# Patient Record
Sex: Female | Born: 1950
Health system: Southern US, Community
[De-identification: ages and names within clinical notes are randomized; demographics above are authoritative.]

## PROBLEM LIST (undated history)

## (undated) DIAGNOSIS — C801 Malignant (primary) neoplasm, unspecified: Secondary | ICD-10-CM

## (undated) DIAGNOSIS — T7840XA Allergy, unspecified, initial encounter: Secondary | ICD-10-CM

## (undated) DIAGNOSIS — E785 Hyperlipidemia, unspecified: Secondary | ICD-10-CM

## (undated) HISTORY — DX: Allergy, unspecified, initial encounter: T78.40XA

## (undated) HISTORY — PX: NASAL SEPTUM SURGERY: SHX37

## (undated) HISTORY — DX: Malignant (primary) neoplasm, unspecified: C80.1

## (undated) HISTORY — PX: NOSE SURGERY: SHX723

## (undated) HISTORY — DX: Hyperlipidemia, unspecified: E78.5

## (undated) HISTORY — PX: TONSILLECTOMY: SUR1361

## (undated) HISTORY — PX: KNEE SURGERY: SHX244

---

## 1998-08-16 ENCOUNTER — Other Ambulatory Visit: Admission: RE | Admit: 1998-08-16 | Discharge: 1998-08-16 | Payer: Self-pay | Admitting: Obstetrics and Gynecology

## 1998-10-18 ENCOUNTER — Ambulatory Visit (HOSPITAL_BASED_OUTPATIENT_CLINIC_OR_DEPARTMENT_OTHER): Admission: RE | Admit: 1998-10-18 | Discharge: 1998-10-18 | Payer: Self-pay | Admitting: *Deleted

## 1999-12-04 ENCOUNTER — Other Ambulatory Visit: Admission: RE | Admit: 1999-12-04 | Discharge: 1999-12-04 | Payer: Self-pay | Admitting: Obstetrics and Gynecology

## 2002-07-12 ENCOUNTER — Other Ambulatory Visit: Admission: RE | Admit: 2002-07-12 | Discharge: 2002-07-12 | Payer: Self-pay | Admitting: Obstetrics and Gynecology

## 2003-08-21 ENCOUNTER — Other Ambulatory Visit: Admission: RE | Admit: 2003-08-21 | Discharge: 2003-08-21 | Payer: Self-pay | Admitting: Obstetrics and Gynecology

## 2005-04-14 ENCOUNTER — Other Ambulatory Visit: Admission: RE | Admit: 2005-04-14 | Discharge: 2005-04-14 | Payer: Self-pay | Admitting: Obstetrics and Gynecology

## 2007-07-21 ENCOUNTER — Encounter: Admission: RE | Admit: 2007-07-21 | Discharge: 2007-07-21 | Payer: Self-pay | Admitting: Family Medicine

## 2008-06-13 ENCOUNTER — Encounter: Admission: RE | Admit: 2008-06-13 | Discharge: 2008-07-06 | Payer: Self-pay | Admitting: Orthopedic Surgery

## 2008-10-11 ENCOUNTER — Ambulatory Visit: Payer: Self-pay | Admitting: Obstetrics and Gynecology

## 2008-10-11 ENCOUNTER — Other Ambulatory Visit: Admission: RE | Admit: 2008-10-11 | Discharge: 2008-10-11 | Payer: Self-pay | Admitting: Obstetrics and Gynecology

## 2008-10-11 ENCOUNTER — Encounter: Payer: Self-pay | Admitting: Obstetrics and Gynecology

## 2008-10-30 ENCOUNTER — Ambulatory Visit: Payer: Self-pay | Admitting: Obstetrics and Gynecology

## 2011-06-13 ENCOUNTER — Encounter: Payer: Self-pay | Admitting: Obstetrics and Gynecology

## 2012-06-08 ENCOUNTER — Encounter: Payer: Self-pay | Admitting: Obstetrics and Gynecology

## 2014-12-13 ENCOUNTER — Encounter: Payer: Self-pay | Admitting: Gynecology

## 2015-01-02 ENCOUNTER — Other Ambulatory Visit (HOSPITAL_COMMUNITY)
Admission: RE | Admit: 2015-01-02 | Discharge: 2015-01-02 | Disposition: A | Discharge status: Home / Self Care | Payer: 59 | Source: Ambulatory Visit | Attending: Gynecology | Admitting: Gynecology

## 2015-01-02 ENCOUNTER — Encounter: Payer: Self-pay | Admitting: Gynecology

## 2015-01-02 ENCOUNTER — Ambulatory Visit (INDEPENDENT_AMBULATORY_CARE_PROVIDER_SITE_OTHER): Payer: 59 | Admitting: Gynecology

## 2015-01-02 VITALS — BP 130/80 | Ht 62.0 in | Wt 132.0 lb

## 2015-01-02 DIAGNOSIS — Z01419 Encounter for gynecological examination (general) (routine) without abnormal findings: Secondary | ICD-10-CM | POA: Diagnosis not present

## 2015-01-02 DIAGNOSIS — Z780 Asymptomatic menopausal state: Secondary | ICD-10-CM | POA: Diagnosis not present

## 2015-01-02 DIAGNOSIS — N952 Postmenopausal atrophic vaginitis: Secondary | ICD-10-CM

## 2015-01-02 DIAGNOSIS — Z1151 Encounter for screening for human papillomavirus (HPV): Secondary | ICD-10-CM | POA: Diagnosis present

## 2015-01-02 DIAGNOSIS — Z79890 Hormone replacement therapy (postmenopausal): Secondary | ICD-10-CM | POA: Diagnosis not present

## 2015-01-02 MED ORDER — ESTRADIOL 10 MCG VA TABS: 1.0000 | ORAL_TABLET | VAGINAL | Status: DC

## 2015-01-02 NOTE — Patient Instructions (Signed)
Estradiol vaginal tablets What is this medicine? ESTRADIOL (es tra DYE ole) vaginal tablet is used to help relieve symptoms of vaginal irritation and dryness that occurs in some women during menopause. This medicine may be used for other purposes; ask your health care provider or pharmacist if you have questions. COMMON BRAND NAME(S): Vagifem What should I tell my health care provider before I take this medicine? They need to know if you have any of these conditions: -abnormal vaginal bleeding -blood vessel disease or blood clots -breast, cervical, endometrial, ovarian, liver, or uterine cancer -dementia -diabetes -gallbladder disease -heart disease or recent heart attack -high blood pressure -high cholesterol -high level of calcium in the blood -hysterectomy -kidney disease -liver disease -migraine headaches -protein C deficiency -protein S deficiency -stroke -systemic lupus erythematosus (SLE) -tobacco smoker -an unusual or allergic reaction to estrogens, other hormones, medicines, foods, dyes, or preservatives -pregnant or trying to get pregnant -breast-feeding How should I use this medicine? This medicine is only for use in the vagina. Do not take by mouth. Wash your hands before and after use. Read package directions carefully. Unwrap the pre-filled applicator package. Lie on your back, part and bend your knees. Gently insert the applicator tip high in the vagina and push the plunger to release the tablet into the vagina. Gently remove the applicator. Throw away the applicator after use. Do not use your medicine more often than directed. Finish the full course prescribed by your doctor or health care professional even if you think your condition is better. Do not stop using except on the advice of your doctor or health care professional. Talk to your pediatrician regarding the use of this medicine in children. A patient package insert for the product will be given with each  prescription and refill. Read this sheet carefully each time. The sheet may change frequently. Overdosage: If you think you have taken too much of this medicine contact a poison control center or emergency room at once. NOTE: This medicine is only for you. Do not share this medicine with others. What if I miss a dose? If you miss a dose, take it as soon as you can. If it is almost time for your next dose, take only that dose. Do not take double or extra doses. What may interact with this medicine? Do not take this medicine with any of the following medications: -aromatase inhibitors like aminoglutethimide, anastrozole, exemestane, letrozole, testolactone This medicine may also interact with the following medications: -antibiotics used to treat tuberculosis like rifabutin, rifampin and rifapentene -raloxifene or tamoxifen -warfarin This list may not describe all possible interactions. Give your health care provider a list of all the medicines, herbs, non-prescription drugs, or dietary supplements you use. Also tell them if you smoke, drink alcohol, or use illegal drugs. Some items may interact with your medicine. What should I watch for while using this medicine? Visit your health care professional for regular checks on your progress. You will need a regular breast and pelvic exam. You should also discuss the need for regular mammograms with your health care professional, and follow his or her guidelines. This medicine can make your body retain fluid, making your fingers, hands, or ankles swell. Your blood pressure can go up. Contact your doctor or health care professional if you feel you are retaining fluid. If you have any reason to think you are pregnant; stop taking this medicine at once and contact your doctor or health care professional. Tobacco smoking increases the risk of getting  a blood clot or having a stroke, especially if you are more than 63 years old. You are strongly advised not to  smoke. If you wear contact lenses and notice visual changes, or if the lenses begin to feel uncomfortable, consult your eye care specialist. If you are going to have elective surgery, you may need to stop taking this medicine beforehand. Consult your health care professional for advice prior to scheduling the surgery. What side effects may I notice from receiving this medicine? Side effects that you should report to your doctor or health care professional as soon as possible: -allergic reactions like skin rash, itching or hives, swelling of the face, lips, or tongue -breast tissue changes or discharge -changes in vision -chest pain -confusion, trouble speaking or understanding -dark urine -general ill feeling or flu-like symptoms -light-colored stools -nausea, vomiting -pain, swelling, warmth in the leg -right upper belly pain -severe headaches -shortness of breath -sudden numbness or weakness of the face, arm or leg -trouble walking, dizziness, loss of balance or coordination -unusual vaginal bleeding -yellowing of the eyes or skin Side effects that usually do not require medical attention (report to your doctor or health care professional if they continue or are bothersome): -hair loss -increased hunger or thirst -increased urination -symptoms of vaginal infection like itching, irritation or unusual discharge -unusually weak or tired This list may not describe all possible side effects. Call your doctor for medical advice about side effects. You may report side effects to FDA at 1-800-FDA-1088. Where should I keep my medicine? Keep out of the reach of children. Store at room temperature between 15 and 30 degrees C (59 and 86 degrees F). Throw away any unused medicine after the expiration date. NOTE: This sheet is a summary. It may not cover all possible information. If you have questions about this medicine, talk to your doctor, pharmacist, or health care provider.  2015,  [employer]. (2010-08-28 09:08:58)

## 2015-01-02 NOTE — Progress Notes (Signed)
Sandra Dickerson November 10, 1950 [id number]   History:    64 y.o.  for annual gyn exam who has not been seen in the office in 2010. Patient denies any prior history of any abnormal Pap smear. Her last bone density study was reported to be normal according to patient. She has not had a colonoscopy as of yet. She has never been on any hormone replacement therapy. She is complaining of vaginal dryness and discomfort during intercourse. She is establishing with a new PCP which she will see in the next few weeks we'll be doing her blood work. 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  Gynecologic History No LMP recorded. Patient is postmenopausal. Contraception: post menopausal status Last Pap: 2010. Results were: normal Last mammogram: 2016. Results were: normal  Obstetric History OB History  Gravida Para Term Preterm AB SAB TAB Ectopic Multiple Living  2 2        2     # Outcome Date GA Lbr Len/2nd Weight Sex Delivery Anes PTL Lv  2 Para     F Vag-Spont     1 Para     M Vag-Spont          ROS: A ROS was performed and pertinent positives and negatives are included in the history.  GENERAL: No fevers or chills. HEENT: No change in vision, no earache, sore throat or sinus congestion. NECK: No pain or stiffness. CARDIOVASCULAR: No chest pain or pressure. No palpitations. PULMONARY: No shortness of breath, cough or wheeze. GASTROINTESTINAL: No abdominal pain, nausea, vomiting or diarrhea, melena or bright red blood per rectum. GENITOURINARY: No urinary frequency, urgency, hesitancy or dysuria. MUSCULOSKELETAL: No joint or muscle pain, no back pain, no recent trauma. DERMATOLOGIC: No rash, no itching, no lesions. ENDOCRINE: No polyuria, polydipsia, no heat or cold intolerance. No recent change in weight. HEMATOLOGICAL: No anemia or easy bruising or bleeding. NEUROLOGIC: No  headache, seizures, numbness, tingling or weakness. PSYCHIATRIC: No depression, no loss of interest in normal activity or change in sleep pattern.     Exam: chaperone present  BP 130/80 mmHg  Ht 5\' 2"  (1.575 m)  Wt 132 lb (59.875 kg)  BMI 24.14 kg/m2  Body mass index is 24.14 kg/(m^2).  General appearance : Well developed well nourished female. No acute distress HEENT: Eyes: no retinal hemorrhage or exudates,  Neck supple, trachea midline, no carotid bruits, no thyroidmegaly Lungs: Clear to auscultation, no rhonchi or wheezes, or rib retractions  Heart: Regular rate and rhythm, no murmurs or gallops Breast:Examined in sitting and supine position were symmetrical in appearance, no palpable masses or tenderness,  no skin retraction, no nipple inversion, no nipple discharge, no skin discoloration, no axillary or supraclavicular lymphadenopathy Abdomen: no palpable masses or tenderness, no rebound or guarding Extremities: no edema or skin discoloration or tenderness  Pelvic:  Bartholin, Urethra, Skene Glands: Within normal limits             Vagina: No gross lesions or discharge, atrophic changes  Cervix: No gross lesions or discharge  Uterus  anteverted, normal size, shape and consistency, non-tender and mobile  Adnexa  Without masses or tenderness  Anus and perineum  normal   Rectovaginal  normal sphincter tone without palpated masses or tenderness             Hemoccult colonoscopy to be scheduled     Assessment/Plan:  65 y.o. female for annual exam with postmenopausal vaginal atrophy causing discomfort during intercourse and irritation. She's having no vasomotor symptoms. She will be prescribed Vagifem 10 g to apply intravaginally twice a week. Risk benefits and pros and cons were discussed. PCP will be drawn her blood work. Pap smear with HPV screening done today since her last Pap smear was 6 years ago. She was given the name of community gastroenterologist to schedule colonoscopy.  She will schedule a bone density study here in the office the next few weeks. We discussed importance of calcium vitamin D and regular exercise for osteoporosis prevention.   Terrance Mass MD, 2:50 PM 01/02/2015

## 2015-01-04 LAB — CYTOLOGY - PAP

## 2015-01-15 ENCOUNTER — Telehealth: Payer: Self-pay | Admitting: Behavioral Health

## 2015-01-15 NOTE — Telephone Encounter (Signed)
Unable to reach patient at time of Pre-Visit Call.  Left message for patient to return call when available.    

## 2015-01-16 ENCOUNTER — Ambulatory Visit (INDEPENDENT_AMBULATORY_CARE_PROVIDER_SITE_OTHER): Payer: 59 | Admitting: Family Medicine

## 2015-01-16 ENCOUNTER — Encounter: Payer: Self-pay | Admitting: Family Medicine

## 2015-01-16 VITALS — BP 126/80 | HR 63 | Temp 98.3°F | Ht 62.0 in | Wt 133.2 lb

## 2015-01-16 DIAGNOSIS — E2839 Other primary ovarian failure: Secondary | ICD-10-CM | POA: Diagnosis not present

## 2015-01-16 DIAGNOSIS — M79671 Pain in right foot: Secondary | ICD-10-CM | POA: Diagnosis not present

## 2015-01-16 DIAGNOSIS — R609 Edema, unspecified: Secondary | ICD-10-CM

## 2015-01-16 DIAGNOSIS — M79672 Pain in left foot: Secondary | ICD-10-CM | POA: Insufficient documentation

## 2015-01-16 DIAGNOSIS — Z23 Encounter for immunization: Secondary | ICD-10-CM

## 2015-01-16 DIAGNOSIS — R252 Cramp and spasm: Secondary | ICD-10-CM

## 2015-01-16 DIAGNOSIS — Z0000 Encounter for general adult medical examination without abnormal findings: Secondary | ICD-10-CM

## 2015-01-16 NOTE — Progress Notes (Signed)
Pre visit review using our clinic review tool, if applicable. No additional management support is needed unless otherwise documented below in the visit note. 

## 2015-01-16 NOTE — Patient Instructions (Signed)
Preventive Care for Adults A healthy lifestyle and preventive care can promote health and wellness. Preventive health guidelines for women include the following key practices.  A routine yearly physical is a good way to check with your health care provider about your health and preventive screening. It is a chance to share any concerns and updates on your health and to receive a thorough exam.  Visit your dentist for a routine exam and preventive care every 6 months. Brush your teeth twice a day and floss once a day. Good oral hygiene prevents tooth decay and gum disease.  The frequency of eye exams is based on your age, health, family medical history, use of contact lenses, and other factors. Follow your health care provider's recommendations for frequency of eye exams.  Eat a healthy diet. Foods like vegetables, fruits, whole grains, low-fat dairy products, and lean protein foods contain the nutrients you need without too many calories. Decrease your intake of foods high in solid fats, added sugars, and salt. Eat the right amount of calories for you.Get information about a proper diet from your health care provider, if necessary.  Regular physical exercise is one of the most important things you can do for your health. Most adults should get at least 150 minutes of moderate-intensity exercise (any activity that increases your heart rate and causes you to sweat) each week. In addition, most adults need muscle-strengthening exercises on 2 or more days a week.  Maintain a healthy weight. The body mass index (BMI) is a screening tool to identify possible weight problems. It provides an estimate of body fat based on height and weight. Your health care provider can find your BMI and can help you achieve or maintain a healthy weight.For adults 20 years and older:  A BMI below 18.5 is considered underweight.  A BMI of 18.5 to 24.9 is normal.  A BMI of 25 to 29.9 is considered overweight.  A BMI of  30 and above is considered obese.  Maintain normal blood lipids and cholesterol levels by exercising and minimizing your intake of saturated fat. Eat a balanced diet with plenty of fruit and vegetables. Blood tests for lipids and cholesterol should begin at age 76 and be repeated every 5 years. If your lipid or cholesterol levels are high, you are over 50, or you are at high risk for heart disease, you may need your cholesterol levels checked more frequently.Ongoing high lipid and cholesterol levels should be treated with medicines if diet and exercise are not working.  If you smoke, find out from your health care provider how to quit. If you do not use tobacco, do not start.  Lung cancer screening is recommended for adults aged 22-80 years who are at high risk for developing lung cancer because of a history of smoking. A yearly low-dose CT scan of the lungs is recommended for people who have at least a 30-pack-year history of smoking and are a current smoker or have quit within the past 15 years. A pack year of smoking is smoking an average of 1 pack of cigarettes a day for 1 year (for example: 1 pack a day for 30 years or 2 packs a day for 15 years). Yearly screening should continue until the smoker has stopped smoking for at least 15 years. Yearly screening should be stopped for people who develop a health problem that would prevent them from having lung cancer treatment.  If you are pregnant, do not drink alcohol. If you are breastfeeding,  be very cautious about drinking alcohol. If you are not pregnant and choose to drink alcohol, do not have more than 1 drink per day. One drink is considered to be 12 ounces (355 mL) of beer, 5 ounces (148 mL) of wine, or 1.5 ounces (44 mL) of liquor.  Avoid use of street drugs. Do not share needles with anyone. Ask for help if you need support or instructions about stopping the use of drugs.  High blood pressure causes heart disease and increases the risk of  stroke. Your blood pressure should be checked at least every 1 to 2 years. Ongoing high blood pressure should be treated with medicines if weight loss and exercise do not work.  If you are 75-52 years old, ask your health care provider if you should take aspirin to prevent strokes.  Diabetes screening involves taking a blood sample to check your fasting blood sugar level. This should be done once every 3 years, after age 15, if you are within normal weight and without risk factors for diabetes. Testing should be considered at a younger age or be carried out more frequently if you are overweight and have at least 1 risk factor for diabetes.  Breast cancer screening is essential preventive care for women. You should practice "breast self-awareness." This means understanding the normal appearance and feel of your breasts and may include breast self-examination. Any changes detected, no matter how small, should be reported to a health care provider. Women in their 58s and 30s should have a clinical breast exam (CBE) by a health care provider as part of a regular health exam every 1 to 3 years. After age 16, women should have a CBE every year. Starting at age 53, women should consider having a mammogram (breast X-ray test) every year. Women who have a family history of breast cancer should talk to their health care provider about genetic screening. Women at a high risk of breast cancer should talk to their health care providers about having an MRI and a mammogram every year.  Breast cancer gene (BRCA)-related cancer risk assessment is recommended for women who have family members with BRCA-related cancers. BRCA-related cancers include breast, ovarian, tubal, and peritoneal cancers. Having family members with these cancers may be associated with an increased risk for harmful changes (mutations) in the breast cancer genes BRCA1 and BRCA2. Results of the assessment will determine the need for genetic counseling and  BRCA1 and BRCA2 testing.  Routine pelvic exams to screen for cancer are no longer recommended for nonpregnant women who are considered low risk for cancer of the pelvic organs (ovaries, uterus, and vagina) and who do not have symptoms. Ask your health care provider if a screening pelvic exam is right for you.  If you have had past treatment for cervical cancer or a condition that could lead to cancer, you need Pap tests and screening for cancer for at least 20 years after your treatment. If Pap tests have been discontinued, your risk factors (such as having a new sexual partner) need to be reassessed to determine if screening should be resumed. Some women have medical problems that increase the chance of getting cervical cancer. In these cases, your health care provider may recommend more frequent screening and Pap tests.  The HPV test is an additional test that may be used for cervical cancer screening. The HPV test looks for the virus that can cause the cell changes on the cervix. The cells collected during the Pap test can be  tested for HPV. The HPV test could be used to screen women aged 30 years and older, and should be used in women of any age who have unclear Pap test results. After the age of 30, women should have HPV testing at the same frequency as a Pap test.  Colorectal cancer can be detected and often prevented. Most routine colorectal cancer screening begins at the age of 50 years and continues through age 75 years. However, your health care provider may recommend screening at an earlier age if you have risk factors for colon cancer. On a yearly basis, your health care provider may provide home test kits to check for hidden blood in the stool. Use of a small camera at the end of a tube, to directly examine the colon (sigmoidoscopy or colonoscopy), can detect the earliest forms of colorectal cancer. Talk to your health care provider about this at age 50, when routine screening begins. Direct  exam of the colon should be repeated every 5-10 years through age 75 years, unless early forms of pre-cancerous polyps or small growths are found.  People who are at an increased risk for hepatitis B should be screened for this virus. You are considered at high risk for hepatitis B if:  You were born in a country where hepatitis B occurs often. Talk with your health care provider about which countries are considered high risk.  Your parents were born in a high-risk country and you have not received a shot to protect against hepatitis B (hepatitis B vaccine).  You have HIV or AIDS.  You use needles to inject street drugs.  You live with, or have sex with, someone who has hepatitis B.  You get hemodialysis treatment.  You take certain medicines for conditions like cancer, organ transplantation, and autoimmune conditions.  Hepatitis C blood testing is recommended for all people born from 1945 through 1965 and any individual with known risks for hepatitis C.  Practice safe sex. Use condoms and avoid high-risk sexual practices to reduce the spread of sexually transmitted infections (STIs). STIs include gonorrhea, chlamydia, syphilis, trichomonas, herpes, HPV, and human immunodeficiency virus (HIV). Herpes, HIV, and HPV are viral illnesses that have no cure. They can result in disability, cancer, and death.  You should be screened for sexually transmitted illnesses (STIs) including gonorrhea and chlamydia if:  You are sexually active and are younger than 24 years.  You are older than 24 years and your health care provider tells you that you are at risk for this type of infection.  Your sexual activity has changed since you were last screened and you are at an increased risk for chlamydia or gonorrhea. Ask your health care provider if you are at risk.  If you are at risk of being infected with HIV, it is recommended that you take a prescription medicine daily to prevent HIV infection. This is  called preexposure prophylaxis (PrEP). You are considered at risk if:  You are a heterosexual woman, are sexually active, and are at increased risk for HIV infection.  You take drugs by injection.  You are sexually active with a partner who has HIV.  Talk with your health care provider about whether you are at high risk of being infected with HIV. If you choose to begin PrEP, you should first be tested for HIV. You should then be tested every 3 months for as long as you are taking PrEP.  Osteoporosis is a disease in which the bones lose minerals and strength   with aging. This can result in serious bone fractures or breaks. The risk of osteoporosis can be identified using a bone density scan. Women ages 65 years and over and women at risk for fractures or osteoporosis should discuss screening with their health care providers. Ask your health care provider whether you should take a calcium supplement or vitamin D to reduce the rate of osteoporosis.  Menopause can be associated with physical symptoms and risks. Hormone replacement therapy is available to decrease symptoms and risks. You should talk to your health care provider about whether hormone replacement therapy is right for you.  Use sunscreen. Apply sunscreen liberally and repeatedly throughout the day. You should seek shade when your shadow is shorter than you. Protect yourself by wearing long sleeves, pants, a wide-brimmed hat, and sunglasses year round, whenever you are outdoors.  Once a month, do a whole body skin exam, using a mirror to look at the skin on your back. Tell your health care provider of new moles, moles that have irregular borders, moles that are larger than a pencil eraser, or moles that have changed in shape or color.  Stay current with required vaccines (immunizations).  Influenza vaccine. All adults should be immunized every year.  Tetanus, diphtheria, and acellular pertussis (Td, Tdap) vaccine. Pregnant women should  receive 1 dose of Tdap vaccine during each pregnancy. The dose should be obtained regardless of the length of time since the last dose. Immunization is preferred during the 27th-36th week of gestation. An adult who has not previously received Tdap or who does not know her vaccine status should receive 1 dose of Tdap. This initial dose should be followed by tetanus and diphtheria toxoids (Td) booster doses every 10 years. Adults with an unknown or incomplete history of completing a 3-dose immunization series with Td-containing vaccines should begin or complete a primary immunization series including a Tdap dose. Adults should receive a Td booster every 10 years.  Varicella vaccine. An adult without evidence of immunity to varicella should receive 2 doses or a second dose if she has previously received 1 dose. Pregnant females who do not have evidence of immunity should receive the first dose after pregnancy. This first dose should be obtained before leaving the health care facility. The second dose should be obtained 4-8 weeks after the first dose.  Human papillomavirus (HPV) vaccine. Females aged 13-26 years who have not received the vaccine previously should obtain the 3-dose series. The vaccine is not recommended for use in pregnant females. However, pregnancy testing is not needed before receiving a dose. If a female is found to be pregnant after receiving a dose, no treatment is needed. In that case, the remaining doses should be delayed until after the pregnancy. Immunization is recommended for any person with an immunocompromised condition through the age of 26 years if she did not get any or all doses earlier. During the 3-dose series, the second dose should be obtained 4-8 weeks after the first dose. The third dose should be obtained 24 weeks after the first dose and 16 weeks after the second dose.  Zoster vaccine. One dose is recommended for adults aged 60 years or older unless certain conditions are  present.  Measles, mumps, and rubella (MMR) vaccine. Adults born before 1957 generally are considered immune to measles and mumps. Adults born in 1957 or later should have 1 or more doses of MMR vaccine unless there is a contraindication to the vaccine or there is laboratory evidence of immunity to   each of the three diseases. A routine second dose of MMR vaccine should be obtained at least 28 days after the first dose for students attending postsecondary schools, health care workers, or international travelers. People who received inactivated measles vaccine or an unknown type of measles vaccine during 1963-1967 should receive 2 doses of MMR vaccine. People who received inactivated mumps vaccine or an unknown type of mumps vaccine before 1979 and are at high risk for mumps infection should consider immunization with 2 doses of MMR vaccine. For females of childbearing age, rubella immunity should be determined. If there is no evidence of immunity, females who are not pregnant should be vaccinated. If there is no evidence of immunity, females who are pregnant should delay immunization until after pregnancy. Unvaccinated health care workers born before 1957 who lack laboratory evidence of measles, mumps, or rubella immunity or laboratory confirmation of disease should consider measles and mumps immunization with 2 doses of MMR vaccine or rubella immunization with 1 dose of MMR vaccine.  Pneumococcal 13-valent conjugate (PCV13) vaccine. When indicated, a person who is uncertain of her immunization history and has no record of immunization should receive the PCV13 vaccine. An adult aged 19 years or older who has certain medical conditions and has not been previously immunized should receive 1 dose of PCV13 vaccine. This PCV13 should be followed with a dose of pneumococcal polysaccharide (PPSV23) vaccine. The PPSV23 vaccine dose should be obtained at least 8 weeks after the dose of PCV13 vaccine. An adult aged 19  years or older who has certain medical conditions and previously received 1 or more doses of PPSV23 vaccine should receive 1 dose of PCV13. The PCV13 vaccine dose should be obtained 1 or more years after the last PPSV23 vaccine dose.  Pneumococcal polysaccharide (PPSV23) vaccine. When PCV13 is also indicated, PCV13 should be obtained first. All adults aged 65 years and older should be immunized. An adult younger than age 65 years who has certain medical conditions should be immunized. Any person who resides in a nursing home or long-term care facility should be immunized. An adult smoker should be immunized. People with an immunocompromised condition and certain other conditions should receive both PCV13 and PPSV23 vaccines. People with human immunodeficiency virus (HIV) infection should be immunized as soon as possible after diagnosis. Immunization during chemotherapy or radiation therapy should be avoided. Routine use of PPSV23 vaccine is not recommended for American Indians, [place] Natives, or people younger than 65 years unless there are medical conditions that require PPSV23 vaccine. When indicated, people who have unknown immunization and have no record of immunization should receive PPSV23 vaccine. One-time revaccination 5 years after the first dose of PPSV23 is recommended for people aged 19-64 years who have chronic kidney failure, nephrotic syndrome, asplenia, or immunocompromised conditions. People who received 1-2 doses of PPSV23 before age 65 years should receive another dose of PPSV23 vaccine at age 65 years or later if at least 5 years have passed since the previous dose. Doses of PPSV23 are not needed for people immunized with PPSV23 at or after age 65 years.  Meningococcal vaccine. Adults with asplenia or persistent complement component deficiencies should receive 2 doses of quadrivalent meningococcal conjugate (MenACWY-D) vaccine. The doses should be obtained at least 2 months apart.  Microbiologists working with certain meningococcal bacteria, military recruits, people at risk during an outbreak, and people who travel to or live in countries with a high rate of meningitis should be immunized. A first-year college student up through age   21 years who is living in a residence hall should receive a dose if she did not receive a dose on or after her 16th birthday. Adults who have certain high-risk conditions should receive one or more doses of vaccine.  Hepatitis A vaccine. Adults who wish to be protected from this disease, have certain high-risk conditions, work with hepatitis A-infected animals, work in hepatitis A research labs, or travel to or work in countries with a high rate of hepatitis A should be immunized. Adults who were previously unvaccinated and who anticipate close contact with an international adoptee during the first 60 days after arrival in the [place] from a country with a high rate of hepatitis A should be immunized.  Hepatitis B vaccine. Adults who wish to be protected from this disease, have certain high-risk conditions, may be exposed to blood or other infectious body fluids, are household contacts or sex partners of hepatitis B positive people, are clients or workers in certain care facilities, or travel to or work in countries with a high rate of hepatitis B should be immunized.  Haemophilus influenzae type b (Hib) vaccine. A previously unvaccinated person with asplenia or sickle cell disease or having a scheduled splenectomy should receive 1 dose of Hib vaccine. Regardless of previous immunization, a recipient of a hematopoietic stem cell transplant should receive a 3-dose series 6-12 months after her successful transplant. Hib vaccine is not recommended for adults with HIV infection. Preventive Services / Frequency Ages 64 to 68 years  Blood pressure check.** / Every 1 to 2 years.  Lipid and cholesterol check.** / Every 5 years beginning at age  22.  Clinical breast exam.** / Every 3 years for women in their 88s and 53s.  BRCA-related cancer risk assessment.** / For women who have family members with a BRCA-related cancer (breast, ovarian, tubal, or peritoneal cancers).  Pap test.** / Every 2 years from ages 90 through 51. Every 3 years starting at age 21 through age 56 or 3 with a history of 3 consecutive normal Pap tests.  HPV screening.** / Every 3 years from ages 24 through ages 1 to 46 with a history of 3 consecutive normal Pap tests.  Hepatitis C blood test.** / For any individual with known risks for hepatitis C.  Skin self-exam. / Monthly.  Influenza vaccine. / Every year.  Tetanus, diphtheria, and acellular pertussis (Tdap, Td) vaccine.** / Consult your health care provider. Pregnant women should receive 1 dose of Tdap vaccine during each pregnancy. 1 dose of Td every 10 years.  Varicella vaccine.** / Consult your health care provider. Pregnant females who do not have evidence of immunity should receive the first dose after pregnancy.  HPV vaccine. / 3 doses over 6 months, if 72 and younger. The vaccine is not recommended for use in pregnant females. However, pregnancy testing is not needed before receiving a dose.  Measles, mumps, rubella (MMR) vaccine.** / You need at least 1 dose of MMR if you were born in 1957 or later. You may also need a 2nd dose. For females of childbearing age, rubella immunity should be determined. If there is no evidence of immunity, females who are not pregnant should be vaccinated. If there is no evidence of immunity, females who are pregnant should delay immunization until after pregnancy.  Pneumococcal 13-valent conjugate (PCV13) vaccine.** / Consult your health care provider.  Pneumococcal polysaccharide (PPSV23) vaccine.** / 1 to 2 doses if you smoke cigarettes or if you have certain conditions.  Meningococcal vaccine.** /  1 dose if you are age 19 to 21 years and a first-year college  student living in a residence hall, or have one of several medical conditions, you need to get vaccinated against meningococcal disease. You may also need additional booster doses.  Hepatitis A vaccine.** / Consult your health care provider.  Hepatitis B vaccine.** / Consult your health care provider.  Haemophilus influenzae type b (Hib) vaccine.** / Consult your health care provider. Ages 40 to 64 years  Blood pressure check.** / Every 1 to 2 years.  Lipid and cholesterol check.** / Every 5 years beginning at age 20 years.  Lung cancer screening. / Every year if you are aged 55-80 years and have a 30-pack-year history of smoking and currently smoke or have quit within the past 15 years. Yearly screening is stopped once you have quit smoking for at least 15 years or develop a health problem that would prevent you from having lung cancer treatment.  Clinical breast exam.** / Every year after age 40 years.  BRCA-related cancer risk assessment.** / For women who have family members with a BRCA-related cancer (breast, ovarian, tubal, or peritoneal cancers).  Mammogram.** / Every year beginning at age 40 years and continuing for as long as you are in good health. Consult with your health care provider.  Pap test.** / Every 3 years starting at age 30 years through age 65 or 70 years with a history of 3 consecutive normal Pap tests.  HPV screening.** / Every 3 years from ages 30 years through ages 65 to 70 years with a history of 3 consecutive normal Pap tests.  Fecal occult blood test (FOBT) of stool. / Every year beginning at age 50 years and continuing until age 75 years. You may not need to do this test if you get a colonoscopy every 10 years.  Flexible sigmoidoscopy or colonoscopy.** / Every 5 years for a flexible sigmoidoscopy or every 10 years for a colonoscopy beginning at age 50 years and continuing until age 75 years.  Hepatitis C blood test.** / For all people born from 1945 through  1965 and any individual with known risks for hepatitis C.  Skin self-exam. / Monthly.  Influenza vaccine. / Every year.  Tetanus, diphtheria, and acellular pertussis (Tdap/Td) vaccine.** / Consult your health care provider. Pregnant women should receive 1 dose of Tdap vaccine during each pregnancy. 1 dose of Td every 10 years.  Varicella vaccine.** / Consult your health care provider. Pregnant females who do not have evidence of immunity should receive the first dose after pregnancy.  Zoster vaccine.** / 1 dose for adults aged 60 years or older.  Measles, mumps, rubella (MMR) vaccine.** / You need at least 1 dose of MMR if you were born in 1957 or later. You may also need a 2nd dose. For females of childbearing age, rubella immunity should be determined. If there is no evidence of immunity, females who are not pregnant should be vaccinated. If there is no evidence of immunity, females who are pregnant should delay immunization until after pregnancy.  Pneumococcal 13-valent conjugate (PCV13) vaccine.** / Consult your health care provider.  Pneumococcal polysaccharide (PPSV23) vaccine.** / 1 to 2 doses if you smoke cigarettes or if you have certain conditions.  Meningococcal vaccine.** / Consult your health care provider.  Hepatitis A vaccine.** / Consult your health care provider.  Hepatitis B vaccine.** / Consult your health care provider.  Haemophilus influenzae type b (Hib) vaccine.** / Consult your health care provider. Ages 65   years and over  Blood pressure check.** / Every 1 to 2 years.  Lipid and cholesterol check.** / Every 5 years beginning at age 22 years.  Lung cancer screening. / Every year if you are aged 73-80 years and have a 30-pack-year history of smoking and currently smoke or have quit within the past 15 years. Yearly screening is stopped once you have quit smoking for at least 15 years or develop a health problem that would prevent you from having lung cancer  treatment.  Clinical breast exam.** / Every year after age 4 years.  BRCA-related cancer risk assessment.** / For women who have family members with a BRCA-related cancer (breast, ovarian, tubal, or peritoneal cancers).  Mammogram.** / Every year beginning at age 40 years and continuing for as long as you are in good health. Consult with your health care provider.  Pap test.** / Every 3 years starting at age 9 years through age 34 or 91 years with 3 consecutive normal Pap tests. Testing can be stopped between 65 and 70 years with 3 consecutive normal Pap tests and no abnormal Pap or HPV tests in the past 10 years.  HPV screening.** / Every 3 years from ages 57 years through ages 64 or 45 years with a history of 3 consecutive normal Pap tests. Testing can be stopped between 65 and 70 years with 3 consecutive normal Pap tests and no abnormal Pap or HPV tests in the past 10 years.  Fecal occult blood test (FOBT) of stool. / Every year beginning at age 15 years and continuing until age 17 years. You may not need to do this test if you get a colonoscopy every 10 years.  Flexible sigmoidoscopy or colonoscopy.** / Every 5 years for a flexible sigmoidoscopy or every 10 years for a colonoscopy beginning at age 86 years and continuing until age 71 years.  Hepatitis C blood test.** / For all people born from [birth year] through 1964 and any individual with known risks for hepatitis C.  Osteoporosis screening.** / A one-time screening for women ages 83 years and over and women at risk for fractures or osteoporosis.  Skin self-exam. / Monthly.  Influenza vaccine. / Every year.  Tetanus, diphtheria, and acellular pertussis (Tdap/Td) vaccine.** / 1 dose of Td every 10 years.  Varicella vaccine.** / Consult your health care provider.  Zoster vaccine.** / 1 dose for adults aged 61 years or older.  Pneumococcal 13-valent conjugate (PCV13) vaccine.** / Consult your health care provider.  Pneumococcal  polysaccharide (PPSV23) vaccine.** / 1 dose for all adults aged 28 years and older.  Meningococcal vaccine.** / Consult your health care provider.  Hepatitis A vaccine.** / Consult your health care provider.  Hepatitis B vaccine.** / Consult your health care provider.  Haemophilus influenzae type b (Hib) vaccine.** / Consult your health care provider. ** Family history and personal history of risk and conditions may change your health care provider's recommendations. Document Released: 07/22/2001 Document Revised: 10/10/2013 Document Reviewed: 10/21/2010 [facility] Patient Information 2015 [place], [place]. This information is not intended to replace advice given to you by your health care provider. Make sure you discuss any questions you have with your health care provider.

## 2015-01-16 NOTE — Progress Notes (Signed)
Subjective:     Sandra Dickerson is a 64 y.o. female and is here for a comprehensive physical exam. The patient reports problems - b/l foot pain and edema.  no sob or cp or palpitations. .  History   Social History  . Marital Status: Married    Spouse Name: N/A  . Number of Children: N/A  . Years of Education: N/A   Occupational History  .      embroidery    Social History Main Topics  . Smoking status: Never Smoker   . Smokeless tobacco: Not on file  . Alcohol Use: 0.0 oz/week    0 Standard drinks or equivalent per week     Comment: occ  . Drug Use: Not on file  . Sexual Activity:    Partners: Male   Other Topics Concern  . Not on file   Social History Narrative   Exercise-- gym    Health Maintenance  Topic Date Due  . Hepatitis C Screening  Oct 26, 1950  . TETANUS/TDAP  03/17/1971  . COLONOSCOPY  03/16/2002  . ZOSTAVAX  03/16/2012  . INFLUENZA VACCINE  01/08/2015  . HIV Screening  01/16/2016 (Originally 03/17/1967)  . MAMMOGRAM  12/12/2016  . PAP SMEAR  01/01/2018    The following portions of the patient's history were reviewed and updated as appropriate:  She  has no past medical history on file. She  does not have any pertinent problems on file. She  has past surgical history that includes Knee surgery (Right); Nose surgery; and Nasal septum surgery. Her family history includes Breast cancer in her maternal aunt and maternal grandmother; CAD in her father; Heart disease in her father and mother; Hyperlipidemia in her father; Hypertension in her father and mother; Kidney disease in her father. She  reports that she has never smoked. She does not have any smokeless tobacco history on file. She reports that she drinks alcohol. Her drug history is not on file. She has a current medication list which includes the following prescription(s): aspirin, calcium carbonate, cholecalciferol, co-enzyme q-10, estradiol, multivitamin, and fish oil. Current Outpatient Prescriptions  on File Prior to Visit  Medication Sig Dispense Refill  . aspirin 81 MG tablet Take 81 mg by mouth daily.    . calcium carbonate (OS-CAL) 600 MG TABS tablet Take 600 mg by mouth 2 (two) times daily with a meal.    . cholecalciferol (VITAMIN D) 1000 UNITS tablet Take 1,000 Units by mouth daily.    Marland Kitchen co-enzyme Q-10 30 MG capsule Take 30 mg by mouth 3 (three) times daily.    . Estradiol 10 MCG TABS vaginal tablet Place 1 tablet (10 mcg total) vaginally 2 (two) times a week. 8 tablet 11  . Multiple Vitamin (MULTIVITAMIN) tablet Take 1 tablet by mouth daily.     No current facility-administered medications on file prior to visit.   She is allergic to codeine..  Review of Systems Review of Systems  Constitutional: Negative for activity change, appetite change and fatigue.  HENT: Negative for hearing loss, congestion, tinnitus and ear discharge.  dentist q69m Eyes: Negative for visual disturbance (see optho q1y -- vision corrected to 20/20 with glasses).  Respiratory: Negative for cough, chest tightness and shortness of breath.   Cardiovascular: Negative for chest pain, palpitations and leg swelling.  Gastrointestinal: Negative for abdominal pain, diarrhea, constipation and abdominal distention.  Genitourinary: Negative for urgency, frequency, decreased urine volume and difficulty urinating.  Musculoskeletal: Negative for back pain, arthralgias and gait problem.  Skin: Negative for color change, pallor and rash.  Neurological: Negative for dizziness, light-headedness, numbness and headaches.  Hematological: Negative for adenopathy. Does not bruise/bleed easily.  Psychiatric/Behavioral: Negative for suicidal ideas, confusion, sleep disturbance, self-injury, dysphoric mood, decreased concentration and agitation.       Objective:    BP 126/80 mmHg  Pulse 63  Temp(Src) 98.3 F (36.8 C) (Oral)  Ht 5\' 2"  (1.575 m)  Wt 133 lb 3.2 oz (60.419 kg)  BMI 24.36 kg/m2  SpO2 97% General  appearance: alert, cooperative, appears stated age and no distress Head: Normocephalic, without obvious abnormality, atraumatic Eyes: conjunctivae/corneas clear. PERRL, EOM's intact. Fundi benign. Ears: normal TM's and external ear canals both ears Nose: Nares normal. Septum midline. Mucosa normal. No drainage or sinus tenderness. Throat: lips, mucosa, and tongue normal; teeth and gums normal Neck: no adenopathy, no carotid bruit, no JVD, supple, symmetrical, trachea midline and thyroid not enlarged, symmetric, no tenderness/mass/nodules Back: symmetric, no curvature. ROM normal. No CVA tenderness. Lungs: clear to auscultation bilaterally Breasts: gyn Heart: regular rate and rhythm, S1, S2 normal, no murmur, click, rub or gallop Abdomen: soft, non-tender; bowel sounds normal; no masses,  no organomegaly Pelvic: deferred Extremities: extremities normal, atraumatic, no cyanosis or edema Pulses: 2+ and symmetric Skin: Skin color, texture, turgor normal. No rashes or lesions Lymph nodes: Cervical, supraclavicular, and axillary nodes normal. Neurologic: Alert and oriented X 3, normal strength and tone. Normal symmetric reflexes. Normal coordination and gait Psych-- no depression, no anxiety      Assessment:    Healthy female exam.      Plan:   See avs   ghm utd See After Visit Summary for Counseling Recommendations    1. Cramp of both lower extremities Check labs Calcium/ oj or banana    - Basic metabolic panel; Future - Magnesium; Future  2. Foot pain, bilateral ? Arthritis Only occurs after standing for long periods D/w changing type of shoes  Tylenol arthritis   3. Preventative health care - Basic metabolic panel; Future - CBC with Differential/Platelet; Future - Hepatic function panel; Future - Lipid panel; Future - POCT urinalysis dipstick; Future - TSH; Future - Ambulatory referral to Gastroenterology - Hepatitis C antibody; Future  4. Estrogen deficiency  -  DG Bone Density; Future  5. Edema  - EKG 12-Lead  6. Need for diphtheria-tetanus-pertussis (Tdap) vaccine, adult/adolescent  - Tdap vaccine greater than or equal to 7yo IM

## 2015-01-17 ENCOUNTER — Other Ambulatory Visit (INDEPENDENT_AMBULATORY_CARE_PROVIDER_SITE_OTHER): Payer: 59

## 2015-01-17 DIAGNOSIS — R252 Cramp and spasm: Secondary | ICD-10-CM

## 2015-01-17 DIAGNOSIS — Z0000 Encounter for general adult medical examination without abnormal findings: Secondary | ICD-10-CM

## 2015-01-17 LAB — CBC WITH DIFFERENTIAL/PLATELET
Basophils Absolute: 0.1 10*3/uL (ref 0.0–0.1)
Basophils Relative: 1.2 % (ref 0.0–3.0)
Eosinophils Absolute: 0.4 10*3/uL (ref 0.0–0.7)
Eosinophils Relative: 7.1 % — ABNORMAL HIGH (ref 0.0–5.0)
HCT: 38.2 % (ref 36.0–46.0)
Hemoglobin: 12.4 g/dL (ref 12.0–15.0)
Lymphocytes Relative: 23 % (ref 12.0–46.0)
Lymphs Abs: 1.3 10*3/uL (ref 0.7–4.0)
MCHC: 32.6 g/dL (ref 30.0–36.0)
MCV: 88.8 fl (ref 78.0–100.0)
Monocytes Absolute: 0.5 10*3/uL (ref 0.1–1.0)
Monocytes Relative: 9.6 % (ref 3.0–12.0)
Neutro Abs: 3.2 10*3/uL (ref 1.4–7.7)
Neutrophils Relative %: 59.1 % (ref 43.0–77.0)
Platelets: 262 10*3/uL (ref 150.0–400.0)
RBC: 4.3 Mil/uL (ref 3.87–5.11)
RDW: 15 % (ref 11.5–15.5)
WBC: 5.5 10*3/uL (ref 4.0–10.5)

## 2015-01-17 LAB — HEPATIC FUNCTION PANEL
ALT: 12 U/L (ref 0–35)
AST: 19 U/L (ref 0–37)
Albumin: 4.6 g/dL (ref 3.5–5.2)
Alkaline Phosphatase: 57 U/L (ref 39–117)
Bilirubin, Direct: 0.2 mg/dL (ref 0.0–0.3)
Total Bilirubin: 1.1 mg/dL (ref 0.2–1.2)
Total Protein: 7.6 g/dL (ref 6.0–8.3)

## 2015-01-17 LAB — TSH: TSH: 3.39 u[IU]/mL (ref 0.35–4.50)

## 2015-01-17 LAB — HEPATITIS C ANTIBODY: HCV Ab: NEGATIVE

## 2015-01-17 LAB — LIPID PANEL
Cholesterol: 178 mg/dL (ref 0–200)
HDL: 58.7 mg/dL (ref >39.00)
LDL Cholesterol: 107 mg/dL — ABNORMAL HIGH (ref 0–99)
NonHDL: 119.05
Total CHOL/HDL Ratio: 3
Triglycerides: 60 mg/dL (ref 0.0–149.0)
VLDL: 12 mg/dL (ref 0.0–40.0)

## 2015-01-17 LAB — BASIC METABOLIC PANEL
BUN: 14 mg/dL (ref 6–23)
CO2: 29 mEq/L (ref 19–32)
Calcium: 9.7 mg/dL (ref 8.4–10.5)
Chloride: 101 mEq/L (ref 96–112)
Creatinine, Ser: 1.02 mg/dL (ref 0.40–1.20)
GFR: 58.2 mL/min — ABNORMAL LOW (ref >60.00)
Glucose, Bld: 89 mg/dL (ref 70–99)
Potassium: 4 mEq/L (ref 3.5–5.1)
Sodium: 140 mEq/L (ref 135–145)

## 2015-01-17 LAB — MAGNESIUM: Magnesium: 2.1 mg/dL (ref 1.5–2.5)

## 2015-01-24 ENCOUNTER — Encounter: Payer: Self-pay | Admitting: Obstetrics and Gynecology

## 2015-06-22 ENCOUNTER — Ambulatory Visit (INDEPENDENT_AMBULATORY_CARE_PROVIDER_SITE_OTHER): Payer: BLUE CROSS/BLUE SHIELD | Admitting: Family Medicine

## 2015-06-22 VITALS — BP 114/80 | HR 68 | Temp 97.9°F | Wt 135.6 lb

## 2015-06-22 DIAGNOSIS — L42 Pityriasis rosea: Secondary | ICD-10-CM | POA: Diagnosis not present

## 2015-06-22 NOTE — Progress Notes (Signed)
Pre visit review using our clinic review tool, if applicable. No additional management support is needed unless otherwise documented below in the visit note. 

## 2015-06-22 NOTE — Patient Instructions (Signed)
          0Pityriasis Rosea Pityriasis rosea is a rash that usually appears on the trunk of the body. It may also appear on the upper arms and upper legs. It usually begins as a single patch, and then more patches begin to develop. The rash may cause mild itching, but it normally does not cause other problems. It usually goes away without treatment. However, it may take weeks or months for the rash to go away completely. CAUSES The cause of this condition is not known. The condition does not spread from person to person (is noncontagious). RISK FACTORS This condition is more likely to develop in young adults and children. It is most common in the spring and fall. SYMPTOMS The main symptom of this condition is a rash.  The rash usually begins with a single oval patch that is larger than the ones that follow. This is called a herald patch. It generally appears a week or more before the rest of the rash appears.  When more patches start to develop, they spread quickly on the trunk, back, and arms. These patches are smaller than the first one.  The patches that make up the rash are usually oval-shaped and pink or red in color. They are usually flat, but they may sometimes be raised so that they can be felt with a finger. They may also be finely crinkled and have a scaly ring around the edge.  The rash does not typically appear on areas of the skin that are exposed to the sun. Most people who have this condition do not have other symptoms, but some have mild itching. In a few cases, a mild headache or body aches may occur before the rash appears and then go away. DIAGNOSIS Your health care provider may diagnose this condition by doing a physical exam and taking your medical history. To rule out other possible causes for the rash, the health care provider may order blood tests or take a skin sample from the rash to be looked at under a microscope. TREATMENT Usually, treatment is not needed for  this condition. The rash will probably go away on its own in 4-8 weeks. In some cases, a health care provider may recommend or prescribe medicine to reduce itching. HOME CARE INSTRUCTIONS  Take medicines only as directed by your health care provider.  Avoid scratching the affected areas of skin.  Do not take hot baths or use a sauna. Use only warm water when bathing or showering. Heat can increase itching. SEEK MEDICAL CARE IF:  Your rash does not go away in 8 weeks.  Your rash gets much worse.  You have a fever.  You have swelling or pain in the rash area.  You have fluid, blood, or pus coming from the rash area.   This information is not intended to replace advice given to you by your health care provider. Make sure you discuss any questions you have with your health care provider.   Document Released: 07/02/2001 Document Revised: 10/10/2014 Document Reviewed: 05/03/2014 Elsevier Interactive Patient Education [employer].

## 2015-06-24 ENCOUNTER — Encounter: Payer: Self-pay | Admitting: Family Medicine

## 2015-06-24 DIAGNOSIS — L42 Pityriasis rosea: Secondary | ICD-10-CM | POA: Insufficient documentation

## 2015-06-24 NOTE — Assessment & Plan Note (Signed)
D/w with pt it was viral and would go away on its own It is not itchy-- causing no problem for the pt She will rto if symptoms develop

## 2015-06-24 NOTE — Progress Notes (Signed)
Patient ID: Sandra Dickerson, female    DOB: 1950/07/07  Age: 65 y.o. MRN: [id number]    Subjective:  Subjective HPI TERISHA BORUM presents for rash on her back that her husband noticed.  It does not itch or burn.  No new soaps, detergents, lotions, etc.  No new meds -- no insect bites.    Review of Systems  Constitutional: Negative for diaphoresis, appetite change, fatigue and unexpected weight change.  Eyes: Negative for pain, redness and visual disturbance.  Respiratory: Negative for cough, chest tightness, shortness of breath and wheezing.   Cardiovascular: Negative for chest pain, palpitations and leg swelling.  Endocrine: Negative for cold intolerance, heat intolerance, polydipsia, polyphagia and polyuria.  Genitourinary: Negative for dysuria, frequency and difficulty urinating.  Skin: Positive for rash. Negative for color change.  Neurological: Negative for dizziness, light-headedness, numbness and headaches.    History No past medical history on file.  She has past surgical history that includes Knee surgery (Right); Nose surgery; and Nasal septum surgery.   Her family history includes Breast cancer in her maternal aunt and maternal grandmother; CAD in her father; Heart disease in her father and mother; Hyperlipidemia in her father; Hypertension in her father and mother; Kidney disease in her father.She reports that she has never smoked. She does not have any smokeless tobacco history on file. She reports that she drinks alcohol. Her drug history is not on file.  Current Outpatient Prescriptions on File Prior to Visit  Medication Sig Dispense Refill  . aspirin 81 MG tablet Take 81 mg by mouth daily.    . calcium carbonate (OS-CAL) 600 MG TABS tablet Take 600 mg by mouth 2 (two) times daily with a meal.    . cholecalciferol (VITAMIN D) 1000 UNITS tablet Take 1,000 Units by mouth daily.    Marland Kitchen co-enzyme Q-10 30 MG capsule Take 30 mg by mouth 3 (three) times daily.    . Estradiol 10  MCG TABS vaginal tablet Place 1 tablet (10 mcg total) vaginally 2 (two) times a week. 8 tablet 11  . Multiple Vitamin (MULTIVITAMIN) tablet Take 1 tablet by mouth daily.    . Omega-3 Fatty Acids (FISH OIL) 1000 MG CAPS Take 1 capsule by mouth daily.     No current facility-administered medications on file prior to visit.     Objective:  Objective Physical Exam  Constitutional: She is oriented to person, place, and time. She appears well-developed and well-nourished.  HENT:  Head: Normocephalic and atraumatic.  Eyes: Conjunctivae and EOM are normal.  Neck: Normal range of motion. Neck supple. No JVD present. Carotid bruit is not present. No thyromegaly present.  Pulmonary/Chest: Effort normal and breath sounds normal. No respiratory distress.  Musculoskeletal: She exhibits no edema.  Neurological: She is alert and oriented to person, place, and time.  Skin: Rash noted. There is erythema.     Psychiatric: She has a normal mood and affect.  Nursing note and vitals reviewed.  BP 114/80 mmHg  Pulse 68  Temp(Src) 97.9 F (36.6 C) (Oral)  Wt 135 lb 9.6 oz (61.508 kg)  SpO2 96% Wt Readings from Last 3 Encounters:  06/22/15 135 lb 9.6 oz (61.508 kg)  01/16/15 133 lb 3.2 oz (60.419 kg)  01/02/15 132 lb (59.875 kg)     Lab Results  Component Value Date   WBC 5.5 01/17/2015   HGB 12.4 01/17/2015   HCT 38.2 01/17/2015   PLT 262.0 01/17/2015   GLUCOSE 89 01/17/2015   CHOL 178  01/17/2015   TRIG 60.0 01/17/2015   HDL 58.70 01/17/2015   LDLCALC 107* 01/17/2015   ALT 12 01/17/2015   AST 19 01/17/2015   NA 140 01/17/2015   K 4.0 01/17/2015   CL 101 01/17/2015   CREATININE 1.02 01/17/2015   BUN 14 01/17/2015   CO2 29 01/17/2015   TSH 3.39 01/17/2015    No results found.   Assessment & Plan:  Plan I am having Ms. Dacey maintain her multivitamin, calcium carbonate, cholecalciferol, aspirin, co-enzyme Q-10, Estradiol, and Fish Oil.  No orders of the defined types were placed  in this encounter.    Problem List Items Addressed This Visit      Unprioritized   Pityriasis rosea - Primary    D/w with pt it was viral and would go away on its own It is not itchy-- causing no problem for the pt She will rto if symptoms develop          Follow-up: Return if symptoms worsen or fail to improve.  Garnet Koyanagi, DO

## 2016-07-10 ENCOUNTER — Encounter: Payer: Self-pay | Admitting: Gynecology

## 2016-10-22 ENCOUNTER — Encounter: Payer: Self-pay | Admitting: Gynecology

## 2017-05-13 ENCOUNTER — Ambulatory Visit (INDEPENDENT_AMBULATORY_CARE_PROVIDER_SITE_OTHER): Payer: PPO

## 2017-05-13 DIAGNOSIS — Z23 Encounter for immunization: Secondary | ICD-10-CM

## 2017-11-04 DIAGNOSIS — Z1231 Encounter for screening mammogram for malignant neoplasm of breast: Secondary | ICD-10-CM | POA: Diagnosis not present

## 2017-11-04 LAB — HM MAMMOGRAPHY

## 2017-11-07 HISTORY — PX: OTHER SURGICAL HISTORY: SHX169

## 2017-11-09 ENCOUNTER — Telehealth: Payer: Self-pay | Admitting: *Deleted

## 2017-11-09 NOTE — Telephone Encounter (Signed)
Received Mammogram results from [facility]; forwarded to provider/SLS 06/03

## 2017-11-11 ENCOUNTER — Encounter: Payer: Self-pay | Admitting: Family Medicine

## 2017-11-11 DIAGNOSIS — C44519 Basal cell carcinoma of skin of other part of trunk: Secondary | ICD-10-CM | POA: Diagnosis not present

## 2017-11-11 DIAGNOSIS — L570 Actinic keratosis: Secondary | ICD-10-CM | POA: Diagnosis not present

## 2017-11-11 DIAGNOSIS — L249 Irritant contact dermatitis, unspecified cause: Secondary | ICD-10-CM | POA: Diagnosis not present

## 2017-11-11 DIAGNOSIS — L300 Nummular dermatitis: Secondary | ICD-10-CM | POA: Diagnosis not present

## 2017-11-11 DIAGNOSIS — D485 Neoplasm of uncertain behavior of skin: Secondary | ICD-10-CM | POA: Diagnosis not present

## 2017-11-11 DIAGNOSIS — D225 Melanocytic nevi of trunk: Secondary | ICD-10-CM | POA: Diagnosis not present

## 2017-11-11 DIAGNOSIS — L821 Other seborrheic keratosis: Secondary | ICD-10-CM | POA: Diagnosis not present

## 2017-11-18 ENCOUNTER — Encounter: Payer: Self-pay | Admitting: Family Medicine

## 2017-11-26 ENCOUNTER — Telehealth: Payer: Self-pay | Admitting: *Deleted

## 2017-11-26 NOTE — Telephone Encounter (Signed)
Received results from [facility]; forwarded to provider/SLS 06/20

## 2017-12-03 DIAGNOSIS — C44529 Squamous cell carcinoma of skin of other part of trunk: Secondary | ICD-10-CM | POA: Diagnosis not present

## 2017-12-03 DIAGNOSIS — L218 Other seborrheic dermatitis: Secondary | ICD-10-CM | POA: Diagnosis not present

## 2017-12-11 ENCOUNTER — Telehealth: Payer: Self-pay | Admitting: *Deleted

## 2017-12-11 NOTE — Telephone Encounter (Signed)
Received Dermatopathology Report results from [facility]; forwarded to provider/SLS 07/05

## 2017-12-18 ENCOUNTER — Encounter: Payer: Self-pay | Admitting: Family Medicine

## 2017-12-18 DIAGNOSIS — C44519 Basal cell carcinoma of skin of other part of trunk: Secondary | ICD-10-CM | POA: Insufficient documentation

## 2018-04-22 ENCOUNTER — Ambulatory Visit (INDEPENDENT_AMBULATORY_CARE_PROVIDER_SITE_OTHER): Payer: PPO

## 2018-04-22 DIAGNOSIS — Z23 Encounter for immunization: Secondary | ICD-10-CM | POA: Diagnosis not present

## 2018-04-30 ENCOUNTER — Ambulatory Visit (INDEPENDENT_AMBULATORY_CARE_PROVIDER_SITE_OTHER): Payer: PPO | Admitting: Family Medicine

## 2018-04-30 VITALS — BP 129/64 | HR 69 | Temp 98.7°F | Resp 16 | Ht 62.0 in | Wt 124.6 lb

## 2018-04-30 DIAGNOSIS — Z0000 Encounter for general adult medical examination without abnormal findings: Secondary | ICD-10-CM | POA: Diagnosis not present

## 2018-04-30 DIAGNOSIS — E785 Hyperlipidemia, unspecified: Secondary | ICD-10-CM

## 2018-04-30 DIAGNOSIS — Z23 Encounter for immunization: Secondary | ICD-10-CM

## 2018-04-30 DIAGNOSIS — R252 Cramp and spasm: Secondary | ICD-10-CM

## 2018-04-30 NOTE — Patient Instructions (Signed)
Leg Cramps Leg cramps occur when a muscle or muscles tighten and you have no control over this tightening (involuntary muscle contraction). Muscle cramps can develop in any muscle, but the most common place is in the calf muscles of the leg. Those cramps can occur during exercise or when you are at rest. Leg cramps are painful, and they may last for a few seconds to a few minutes. Cramps may return several times before they finally stop. Usually, leg cramps are not caused by a serious medical problem. In many cases, the cause is not known. Some common causes include:  Overexertion.  Overuse from repetitive motions, or doing the same thing over and over.  Remaining in a certain position for a long period of time.  Improper preparation, form, or technique while performing a sport or an activity.  Dehydration.  Injury.  Side effects of some medicines.  Abnormally low levels of the salts and ions in your blood (electrolytes), especially potassium and calcium. These levels could be low if you are taking water pills (diuretics) or if you are pregnant.  Follow these instructions at home: Watch your condition for any changes. Taking the following actions may help to lessen any discomfort that you are feeling:  Stay well-hydrated. Drink enough fluid to keep your urine clear or pale yellow.  Try massaging, stretching, and relaxing the affected muscle. Do this for several minutes at a time.  For tight or tense muscles, use a warm towel, heating pad, or hot shower water directed to the affected area.  If you are sore or have pain after a cramp, applying ice to the affected area may relieve discomfort. ? Put ice in a plastic bag. ? Place a towel between your skin and the bag. ? Leave the ice on for 20 minutes, 2-3 times per day.  Avoid strenuous exercise for several days if you have been having frequent leg cramps.  Make sure that your diet includes the essential minerals for your muscles to  work normally.  Take medicines only as directed by your health care provider.  Contact a health care provider if:  Your leg cramps get more severe or more frequent, or they do not improve over time.  Your foot becomes cold, numb, or blue. This information is not intended to replace advice given to you by your health care provider. Make sure you discuss any questions you have with your health care provider. Document Released: 07/03/2004 Document Revised: 11/01/2015 Document Reviewed: 05/03/2014 Elsevier Interactive Patient Education  2018 [employer]  

## 2018-04-30 NOTE — Progress Notes (Signed)
Patient ID: Sandra Dickerson, female    DOB: 14-Jun-1950  Age: 67 y.o. MRN: [id number]    Subjective:  Subjective  HPI Sandra Dickerson presents for f/u lipid and leg cramps.  Leg cramps at night.  No other complaints.   Review of Systems  Constitutional: Negative for activity change, appetite change, fatigue and unexpected weight change.  Respiratory: Negative for cough and shortness of breath.   Cardiovascular: Negative for chest pain and palpitations.  Musculoskeletal: Positive for myalgias.       Leg cramps  Psychiatric/Behavioral: Negative for behavioral problems and dysphoric mood. The patient is not nervous/anxious.     History History reviewed. No pertinent past medical history.  She has a past surgical history that includes Knee surgery (Right); Nose surgery; and Nasal septum surgery.   Her family history includes Breast cancer in her maternal aunt and maternal grandmother; CAD in her father; Heart disease in her father and mother; Hyperlipidemia in her father; Hypertension in her father and mother; Kidney disease in her father.She reports that she has never smoked. She has never used smokeless tobacco. She reports that she drinks alcohol. Her drug history is not on file.  Current Outpatient Medications on File Prior to Visit  Medication Sig Dispense Refill  . aspirin 81 MG tablet Take 81 mg by mouth daily.    . cholecalciferol (VITAMIN D) 1000 UNITS tablet Take 1,000 Units by mouth daily.    Marland Kitchen co-enzyme Q-10 30 MG capsule Take 30 mg by mouth 3 (three) times daily.    . Estradiol 10 MCG TABS vaginal tablet Place 1 tablet (10 mcg total) vaginally 2 (two) times a week. 8 tablet 11  . GLUCOSAMINE-CHONDROITIN PO Take 1 capsule by mouth daily.    Marland Kitchen MAGNESIUM PO Take 1 tablet by mouth daily.    . Multiple Vitamin (MULTIVITAMIN) tablet Take 1 tablet by mouth daily.    . Omega-3 Fatty Acids (FISH OIL) 1000 MG CAPS Take 1 capsule by mouth daily.     No current facility-administered  medications on file prior to visit.      Objective:  Objective  Physical Exam  Constitutional: She is oriented to person, place, and time. She appears well-developed and well-nourished. No distress.  HENT:  Head: Normocephalic and atraumatic.  Right Ear: External ear normal.  Left Ear: External ear normal.  Nose: Nose normal.  Mouth/Throat: Oropharynx is clear and moist.  Eyes: Pupils are equal, round, and reactive to light. Conjunctivae and EOM are normal.  Neck: Normal range of motion. Neck supple. No JVD present. Carotid bruit is not present. No thyromegaly present.  Cardiovascular: Normal rate, regular rhythm and normal heart sounds.  No murmur heard. Pulmonary/Chest: Effort normal and breath sounds normal. No respiratory distress. She has no wheezes. She has no rales. She exhibits no tenderness.  Musculoskeletal: She exhibits no edema or tenderness.  Neurological: She is alert and oriented to person, place, and time.  Psychiatric: She has a normal mood and affect. Her behavior is normal. Judgment and thought content normal.  Nursing note and vitals reviewed.  BP 129/64 (BP Location: Right Arm, Cuff Size: Normal)   Pulse 69   Temp 98.7 F (37.1 C) (Oral)   Resp 16   Ht 5\' 2"  (1.575 m)   Wt 124 lb 9.6 oz (56.5 kg)   SpO2 98%   BMI 22.79 kg/m  Wt Readings from Last 3 Encounters:  04/30/18 124 lb 9.6 oz (56.5 kg)  06/22/15 135 lb 9.6 oz (  61.5 kg)  01/16/15 133 lb 3.2 oz (60.4 kg)     Lab Results  Component Value Date   WBC 5.5 01/17/2015   HGB 12.4 01/17/2015   HCT 38.2 01/17/2015   PLT 262.0 01/17/2015   GLUCOSE 77 04/30/2018   CHOL 199 04/30/2018   TRIG 73 04/30/2018   HDL 63 04/30/2018   LDLCALC 119 (H) 04/30/2018   ALT 12 04/30/2018   AST 20 04/30/2018   NA 137 04/30/2018   K 4.4 04/30/2018   CL 100 04/30/2018   CREATININE 1.15 (H) 04/30/2018   BUN 23 04/30/2018   CO2 29 04/30/2018   TSH 3.39 01/17/2015    No results found.   Assessment & Plan:    Plan  I have discontinued Cahterine E. Schuur's calcium carbonate. I am also having her maintain her multivitamin, cholecalciferol, aspirin, co-enzyme Q-10, Estradiol, Fish Oil, GLUCOSAMINE-CHONDROITIN PO, and MAGNESIUM PO.  No orders of the defined types were placed in this encounter.   Problem List Items Addressed This Visit      Unprioritized   Hyperlipidemia    Encouraged heart healthy diet, increase exercise, avoid trans fats, consider a krill oil cap daily      Relevant Orders   Comprehensive metabolic panel (Completed)   Lipid panel (Completed)   Leg cramps - Primary    Check labs Take calcium at night        Relevant Orders   Comprehensive metabolic panel (Completed)   Lipid panel (Completed)   Magnesium (Completed)   Preventative health care    rto for cpe      Relevant Orders   Ambulatory referral to Gastroenterology      Follow-up: Return in about 3 months (around 07/31/2018), or if symptoms worsen or fail to improve, for annual exam, fasting.  Ann Held, DO

## 2018-05-01 LAB — COMPREHENSIVE METABOLIC PANEL WITH GFR
AG Ratio: 1.9 (calc) (ref 1.0–2.5)
ALT: 12 U/L (ref 6–29)
AST: 20 U/L (ref 10–35)
Albumin: 4.7 g/dL (ref 3.6–5.1)
Alkaline phosphatase (APISO): 62 U/L (ref 33–130)
BUN/Creatinine Ratio: 20 (calc) (ref 6–22)
BUN: 23 mg/dL (ref 7–25)
CO2: 29 mmol/L (ref 20–32)
Calcium: 10 mg/dL (ref 8.6–10.4)
Chloride: 100 mmol/L (ref 98–110)
Creat: 1.15 mg/dL — ABNORMAL HIGH (ref 0.50–0.99)
Globulin: 2.5 g/dL (ref 1.9–3.7)
Glucose, Bld: 77 mg/dL (ref 65–99)
Potassium: 4.4 mmol/L (ref 3.5–5.3)
Sodium: 137 mmol/L (ref 135–146)
Total Bilirubin: 0.7 mg/dL (ref 0.2–1.2)
Total Protein: 7.2 g/dL (ref 6.1–8.1)

## 2018-05-01 LAB — LIPID PANEL
Cholesterol: 199 mg/dL
HDL: 63 mg/dL
LDL Cholesterol (Calc): 119 mg/dL — ABNORMAL HIGH
Non-HDL Cholesterol (Calc): 136 mg/dL — ABNORMAL HIGH
Total CHOL/HDL Ratio: 3.2 (calc)
Triglycerides: 73 mg/dL

## 2018-05-01 LAB — MAGNESIUM: Magnesium: 2 mg/dL (ref 1.5–2.5)

## 2018-05-02 ENCOUNTER — Encounter: Payer: Self-pay | Admitting: Family Medicine

## 2018-05-02 DIAGNOSIS — E785 Hyperlipidemia, unspecified: Secondary | ICD-10-CM | POA: Insufficient documentation

## 2018-05-02 DIAGNOSIS — Z0000 Encounter for general adult medical examination without abnormal findings: Secondary | ICD-10-CM | POA: Insufficient documentation

## 2018-05-02 NOTE — Assessment & Plan Note (Signed)
Encouraged heart healthy diet, increase exercise, avoid trans fats, consider a krill oil cap daily 

## 2018-05-02 NOTE — Assessment & Plan Note (Signed)
Check labs Take calcium at night

## 2018-05-02 NOTE — Assessment & Plan Note (Signed)
rto for cpe

## 2018-05-03 NOTE — Addendum Note (Signed)
Addended by: Kem Boroughs D on: 05/03/2018 07:55 AM   Modules accepted: Orders

## 2018-06-23 DIAGNOSIS — D1801 Hemangioma of skin and subcutaneous tissue: Secondary | ICD-10-CM | POA: Diagnosis not present

## 2018-06-23 DIAGNOSIS — Z85828 Personal history of other malignant neoplasm of skin: Secondary | ICD-10-CM | POA: Diagnosis not present

## 2018-06-23 DIAGNOSIS — L821 Other seborrheic keratosis: Secondary | ICD-10-CM | POA: Diagnosis not present

## 2018-06-23 DIAGNOSIS — D225 Melanocytic nevi of trunk: Secondary | ICD-10-CM | POA: Diagnosis not present

## 2018-06-23 DIAGNOSIS — L819 Disorder of pigmentation, unspecified: Secondary | ICD-10-CM | POA: Diagnosis not present

## 2018-06-24 ENCOUNTER — Encounter: Payer: Self-pay | Admitting: Gastroenterology

## 2018-06-28 NOTE — Progress Notes (Signed)
Subjective:   Sandra Dickerson is a 68 y.o. female who presents for an Initial Medicare Annual Wellness Visit.  The Patient was informed that the wellness visit is to identify future health risk and educate and initiate measures that can reduce risk for increased disease through the lifespan.   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   Describes health as fair, good or great? Good.  Review of Systems   No ROS.  Medicare Wellness Visit. Additional risk factors are reflected in the social history. Cardiac Risk Factors include: advanced age (>59men, >43 women);dyslipidemia Sleep patterns: varies Home Safety/Smoke Alarms: Feels safe in home. Smoke alarms in place.  Lives in 2 story home with husband and 2 dogs.    Female:   Pap-  2016     Mammo- 11/04/17      Dexa scan-  ordered      [facility]- scheduled 07/16/18 w/ [facility]     Objective:    Today's Vitals   06/29/18 1441  BP: 140/82  Pulse: 67  SpO2: 97%  Weight: 126 lb 9.6 oz (57.4 kg)  Height: 5\' 2"  (1.575 m)   Body mass index is 23.16 kg/m.  Advanced Directives 06/29/2018  Does Patient Have a Medical Advance Directive? No  Would patient like information on creating a medical advance directive? Yes (MAU/Ambulatory/Procedural Areas - Information given)    Current Medications (verified) Outpatient Encounter Medications as of 06/29/2018  Medication Sig  . aspirin 81 MG tablet Take 81 mg by mouth daily.  . cholecalciferol (VITAMIN D) 1000 UNITS tablet Take 1,000 Units by mouth daily.  Marland Kitchen co-enzyme Q-10 30 MG capsule Take 30 mg by mouth 3 (three) times daily.  Marland Kitchen GLUCOSAMINE-CHONDROITIN PO Take 1 capsule by mouth daily.  Marland Kitchen MAGNESIUM PO Take 1 tablet by mouth daily.  . Multiple Vitamin (MULTIVITAMIN) tablet Take 1 tablet by mouth daily.  . Omega-3 Fatty Acids (FISH OIL) 1000 MG CAPS Take 1 capsule by mouth daily.  . Estradiol 10 MCG TABS vaginal tablet Place 1 tablet (10 mcg total) vaginally 2 (two) times a week. (Patient not  taking: Reported on 06/29/2018)   No facility-administered encounter medications on file as of 06/29/2018.     Allergies (verified) Codeine   History: History reviewed. No pertinent past medical history. Past Surgical History:  Procedure Laterality Date  . basal cell removal Right 11/07/2017   right flank  . KNEE SURGERY Right    torn meniscus  . NASAL SEPTUM SURGERY    . NOSE SURGERY     polyp removed   Family History  Problem Relation Age of Onset  . Hypertension Mother   . Heart disease Mother        CHF  . Hypertension Father   . Heart disease Father        chf  . Kidney disease Father   . Hyperlipidemia Father   . CAD Father   . Breast cancer Maternal Aunt   . Breast cancer Maternal Grandmother    Social History   Socioeconomic History  . Marital status: Married    Spouse name: Not on file  . Number of children: Not on file  . Years of education: Not on file  . Highest education level: Not on file  Occupational History    Comment: embroidery   Social Needs  . Financial resource strain: Not on file  . Food insecurity:    Worry: Not on file    Inability: Not on file  .  Transportation needs:    Medical: Not on file    Non-medical: Not on file  Tobacco Use  . Smoking status: Never Smoker  . Smokeless tobacco: Never Used  Substance and Sexual Activity  . Alcohol use: Yes    Alcohol/week: 0.0 standard drinks    Comment: occ  . Drug use: Not on file  . Sexual activity: Not Currently    Partners: Male  Lifestyle  . Physical activity:    Days per week: Not on file    Minutes per session: Not on file  . Stress: Not on file  Relationships  . Social connections:    Talks on phone: Not on file    Gets together: Not on file    Attends religious service: Not on file    Active member of club or organization: Not on file    Attends meetings of clubs or organizations: Not on file    Relationship status: Not on file  Other Topics Concern  . Not on file    Social History Narrative   Exercise-- gym     Tobacco Counseling Counseling given: Not Answered   Clinical Intake:  Pain : No/denies pain    Activities of Daily Living In your present state of health, do you have any difficulty performing the following activities: 06/29/2018  Hearing? N  Vision? N  Difficulty concentrating or making decisions? N  Walking or climbing stairs? N  Dressing or bathing? N  Doing errands, shopping? N  Preparing Food and eating ? N  Using the Toilet? N  In the past six months, have you accidently leaked urine? N  Do you have problems with loss of bowel control? N  Managing your Medications? N  Managing your Finances? N  Housekeeping or managing your Housekeeping? N  Some recent data might be hidden     Immunizations and Health Maintenance Immunization History  Administered Date(s) Administered  . Influenza, High Dose Seasonal PF 05/13/2017, 04/22/2018  . Pneumococcal Conjugate-13 04/30/2018  . Tdap 01/16/2015   Health Maintenance Due  Topic Date Due  . COLONOSCOPY  03/16/2002    Patient Care Team: Carollee Herter, Alferd Apa, DO as PCP - General (Family Medicine) Druscilla Brownie, MD as Referring Physician (Dermatology)  Indicate any recent Medical Services you may have received from other than [facility] providers in the past year (date may be approximate).     Assessment:   This is a routine wellness examination for [place]. Physical assessment deferred to PCP.  Hearing/Vision screen  Hearing Screening   125Hz  250Hz  500Hz  1000Hz  2000Hz  3000Hz  4000Hz  6000Hz  8000Hz   Right ear:   Pass Pass Pass  Pass    Left ear:   Pass Pass Pass  Pass      Visual Acuity Screening   Right eye Left eye Both eyes  Without correction: 20/32 20/32 20/32   With correction:       Dietary issues and exercise activities discussed: Current Exercise Habits: The patient does not participate in regular exercise at present, Exercise limited by: None identified Diet  (meal preparation, eat out, water intake, caffeinated beverages, dairy products, fruits and vegetables): in general, a "healthy" diet  , well balanced, on average, 3 meals per day Breakfast:oatmeal and green tea Lunch: greek yogurt, fruit,water. Dinner:  Chicken, kale, potatoes, white wine.  Goals    . Increase physical activity     Gym 2x/ week for 30 min.    . Maintain healthy lifestyle.      Depression Screen PHQ  2/9 Scores 06/29/2018 05/03/2018  PHQ - 2 Score 0 0  PHQ- 9 Score - 0    Fall Risk Fall Risk  06/29/2018  Falls in the past year? 0      Cognitive Function: MMSE - Mini Mental State Exam 06/29/2018  Orientation to time 5  Orientation to Place 5  Registration 3  Attention/ Calculation 5  Recall 3  Language- name 2 objects 2  Language- repeat 1  Language- follow 3 step command 3  Language- read & follow direction 1  Write a sentence 1  Copy design 1  Total score 30        Screening Tests Health Maintenance  Topic Date Due  . COLONOSCOPY  03/16/2002  . MAMMOGRAM  11/05/2018  . PNA vac Low Risk Adult (2 of 2 - PPSV23) 05/01/2019  . TETANUS/TDAP  01/15/2025  . INFLUENZA VACCINE  Completed  . DEXA SCAN  Completed  . Hepatitis C Screening  Completed        Plan:    Please schedule your next medicare wellness visit with me in 1 yr.  Continue to eat heart healthy diet (full of fruits, vegetables, whole grains, lean protein, water--limit salt, fat, and sugar intake) and increase physical activity as tolerated.  Continue doing brain stimulating activities to keep memory sharp.   Bring a copy of your living will and/or healthcare power of attorney to your next office visit.  I have ordered your bone density scan. Please schedule downstairs.  I have personally reviewed and noted the following in the patient's chart:   . Medical and social history . Use of alcohol, tobacco or illicit drugs  . Current medications and supplements . Functional ability  and status . Nutritional status . Physical activity . Advanced directives . List of other physicians . Hospitalizations, surgeries, and ER visits in previous 12 months . Vitals . Screenings to include cognitive, depression, and falls . Referrals and appointments  In addition, I have reviewed and discussed with patient certain preventive protocols, quality metrics, and best practice recommendations. A written personalized care plan for preventive services as well as general preventive health recommendations were provided to patient.     Shela Nevin, [place]   06/29/2018

## 2018-06-29 ENCOUNTER — Encounter: Payer: Self-pay | Admitting: *Deleted

## 2018-06-29 ENCOUNTER — Ambulatory Visit (INDEPENDENT_AMBULATORY_CARE_PROVIDER_SITE_OTHER): Payer: PPO | Admitting: *Deleted

## 2018-06-29 VITALS — BP 140/82 | HR 67 | Ht 62.0 in | Wt 126.6 lb

## 2018-06-29 DIAGNOSIS — Z0000 Encounter for general adult medical examination without abnormal findings: Secondary | ICD-10-CM | POA: Diagnosis not present

## 2018-06-29 DIAGNOSIS — Z780 Asymptomatic menopausal state: Secondary | ICD-10-CM

## 2018-06-29 NOTE — Patient Instructions (Signed)
Please schedule your next medicare wellness visit with me in 1 yr.  Continue to eat heart healthy diet (full of fruits, vegetables, whole grains, lean protein, water--limit salt, fat, and sugar intake) and increase physical activity as tolerated.  Continue doing brain stimulating activities to keep memory sharp.   Bring a copy of your living will and/or healthcare power of attorney to your next office visit.  I have ordered your bone density scan. Please schedule downstairs.   Sandra Dickerson , Thank you for taking time to come for your Medicare Wellness Visit. I appreciate your ongoing commitment to your health goals. Please review the following plan we discussed and let me know if I can assist you in the future.   These are the goals we discussed: Goals    . Increase physical activity     Gym 2x/ week for 30 min.    . Maintain healthy lifestyle.       This is a list of the screening recommended for you and due dates:  Health Maintenance  Topic Date Due  . Colon Cancer Screening  03/16/2002  . Mammogram  11/05/2018  . Pneumonia vaccines (2 of 2 - PPSV23) 05/01/2019  . Tetanus Vaccine  01/15/2025  . Flu Shot  Completed  . DEXA scan (bone density measurement)  Completed  .  Hepatitis C: One time screening is recommended by Center for Disease Control  (CDC) for  adults born from [birth year] through 1964.   Completed    Health Maintenance After Age 59 After age 43, you are at a higher risk for certain long-term diseases and infections as well as injuries from falls. Falls are a major cause of broken bones and head injuries in people who are older than age 65. Getting regular preventive care can help to keep you healthy and well. Preventive care includes getting regular testing and making lifestyle changes as recommended by your health care provider. Talk with your health care provider about:  Which screenings and tests you should have. A screening is a test that checks for a disease when you  have no symptoms.  A diet and exercise plan that is right for you. What should I know about screenings and tests to prevent falls? Screening and testing are the best ways to find a health problem early. Early diagnosis and treatment give you the best chance of managing medical conditions that are common after age 81. Certain conditions and lifestyle choices may make you more likely to have a fall. Your health care provider may recommend:  Regular vision checks. Poor vision and conditions such as cataracts can make you more likely to have a fall. If you wear glasses, make sure to get your prescription updated if your vision changes.  Medicine review. Work with your health care provider to regularly review all of the medicines you are taking, including over-the-counter medicines. Ask your health care provider about any side effects that may make you more likely to have a fall. Tell your health care provider if any medicines that you take make you feel dizzy or sleepy.  Osteoporosis screening. Osteoporosis is a condition that causes the bones to get weaker. This can make the bones weak and cause them to break more easily.  Blood pressure screening. Blood pressure changes and medicines to control blood pressure can make you feel dizzy.  Strength and balance checks. Your health care provider may recommend certain tests to check your strength and balance while standing, walking, or changing positions.  Foot health exam. Foot pain and numbness, as well as not wearing proper footwear, can make you more likely to have a fall.  Depression screening. You may be more likely to have a fall if you have a fear of falling, feel emotionally low, or feel unable to do activities that you used to do.  Alcohol use screening. Using too much alcohol can affect your balance and may make you more likely to have a fall. What actions can I take to lower my risk of falls? General instructions  Talk with your health care  provider about your risks for falling. Tell your health care provider if: ? You fall. Be sure to tell your health care provider about all falls, even ones that seem minor. ? You feel dizzy, sleepy, or off-balance.  Take over-the-counter and prescription medicines only as told by your health care provider. These include any supplements.  Eat a healthy diet and maintain a healthy weight. A healthy diet includes low-fat dairy products, low-fat (lean) meats, and fiber from whole grains, beans, and lots of fruits and vegetables. Home safety  Remove any tripping hazards, such as rugs, cords, and clutter.  Install safety equipment such as grab bars in bathrooms and safety rails on stairs.  Keep rooms and walkways well-lit. Activity   Follow a regular exercise program to stay fit. This will help you maintain your balance. Ask your health care provider what types of exercise are appropriate for you.  If you need a cane or walker, use it as recommended by your health care provider.  Wear supportive shoes that have nonskid soles. Lifestyle  Do not drink alcohol if your health care provider tells you not to drink.  If you drink alcohol, limit how much you have: ? 0-1 drink a day for women. ? 0-2 drinks a day for men.  Be aware of how much alcohol is in your drink. In the [place] one drink equals one typical bottle of beer (12 oz), one-half glass of wine (5 oz), or one shot of hard liquor (1 oz).  Do not use any products that contain nicotine or tobacco, such as cigarettes and e-cigarettes. If you need help quitting, ask your health care provider. Summary  Having a healthy lifestyle and getting preventive care can help to protect your health and wellness after age 46.  Screening and testing are the best way to find a health problem early and help you avoid having a fall. Early diagnosis and treatment give you the best chance for managing medical conditions that are more common for people who  are older than age 18.  Falls are a major cause of broken bones and head injuries in people who are older than age 29. Take precautions to prevent a fall at home.  Work with your health care provider to learn what changes you can make to improve your health and wellness and to prevent falls. This information is not intended to replace advice given to you by your health care provider. Make sure you discuss any questions you have with your health care provider. Document Released: 04/08/2017 Document Revised: 04/08/2017 Document Reviewed: 04/08/2017 Elsevier Interactive Patient Education  2019 [employer].

## 2018-06-29 NOTE — Progress Notes (Signed)
Reviewed

## 2018-06-30 ENCOUNTER — Ambulatory Visit (HOSPITAL_BASED_OUTPATIENT_CLINIC_OR_DEPARTMENT_OTHER)
Admission: RE | Admit: 2018-06-30 | Discharge: 2018-06-30 | Disposition: A | Discharge status: Home / Self Care | Payer: PPO | Source: Ambulatory Visit | Attending: Family Medicine | Admitting: Family Medicine

## 2018-06-30 DIAGNOSIS — M8589 Other specified disorders of bone density and structure, multiple sites: Secondary | ICD-10-CM | POA: Diagnosis not present

## 2018-06-30 DIAGNOSIS — Z780 Asymptomatic menopausal state: Secondary | ICD-10-CM | POA: Diagnosis present

## 2018-07-02 ENCOUNTER — Ambulatory Visit (AMBULATORY_SURGERY_CENTER): Payer: Self-pay

## 2018-07-02 ENCOUNTER — Encounter: Payer: Self-pay | Admitting: Gastroenterology

## 2018-07-02 VITALS — Ht 62.0 in | Wt 125.8 lb

## 2018-07-02 DIAGNOSIS — Z1211 Encounter for screening for malignant neoplasm of colon: Secondary | ICD-10-CM

## 2018-07-02 NOTE — Progress Notes (Signed)
Denies allergies to eggs or soy products. Denies complication of anesthesia or sedation. Denies use of weight loss medication. Denies use of O2.   Emmi instructions declined.  

## 2018-07-13 ENCOUNTER — Encounter: Payer: Self-pay | Admitting: *Deleted

## 2018-07-16 ENCOUNTER — Ambulatory Visit (AMBULATORY_SURGERY_CENTER): Payer: PPO | Admitting: Gastroenterology

## 2018-07-16 ENCOUNTER — Encounter: Payer: Self-pay | Admitting: Gastroenterology

## 2018-07-16 VITALS — BP 140/85 | HR 57 | Temp 96.8°F | Resp 10 | Ht 62.0 in | Wt 125.0 lb

## 2018-07-16 DIAGNOSIS — Z1211 Encounter for screening for malignant neoplasm of colon: Secondary | ICD-10-CM | POA: Diagnosis not present

## 2018-07-16 DIAGNOSIS — K621 Rectal polyp: Secondary | ICD-10-CM | POA: Diagnosis not present

## 2018-07-16 DIAGNOSIS — D127 Benign neoplasm of rectosigmoid junction: Secondary | ICD-10-CM | POA: Diagnosis not present

## 2018-07-16 DIAGNOSIS — K635 Polyp of colon: Secondary | ICD-10-CM

## 2018-07-16 DIAGNOSIS — D125 Benign neoplasm of sigmoid colon: Secondary | ICD-10-CM

## 2018-07-16 DIAGNOSIS — D128 Benign neoplasm of rectum: Secondary | ICD-10-CM

## 2018-07-16 MED ORDER — SODIUM CHLORIDE 0.9 % IV SOLN: 500.0000 mL | Freq: Once | INTRAVENOUS | Status: DC

## 2018-07-16 NOTE — Op Note (Signed)
[place] Patient Name: Sandra Dickerson Procedure Date: 07/16/2018 1:24 PM MRN: [id number] Endoscopist: Gerrit Heck , MD Age: 68 Referring MD:  Date of Birth: 1950-06-29 Gender: Female Account #: [id number] Procedure:                Colonoscopy Indications:              Screening for colorectal malignant neoplasm, This                            is the patient's first colonoscopy Medicines:                Monitored Anesthesia Care Procedure:                Pre-Anesthesia Assessment:                           - Prior to the procedure, a History and Physical                            was performed, and patient medications and                            allergies were reviewed. The patient's tolerance of                            previous anesthesia was also reviewed. The risks                            and benefits of the procedure and the sedation                            options and risks were discussed with the patient.                            All questions were answered, and informed consent                            was obtained. Prior Anticoagulants: The patient has                            taken no previous anticoagulant or antiplatelet                            agents. ASA Grade Assessment: II - A patient with                            mild systemic disease. After reviewing the risks                            and benefits, the patient was deemed in                            satisfactory condition to undergo the procedure.  After obtaining informed consent, the colonoscope                            was passed under direct vision. Throughout the                            procedure, the patient's blood pressure, pulse, and                            oxygen saturations were monitored continuously. The                            Colonoscope was introduced through the anus and                            advanced to the the cecum,  identified by                            appendiceal orifice and ileocecal valve. The                            colonoscopy was performed without difficulty. The                            patient tolerated the procedure well. The quality                            of the bowel preparation was adequate. The                            ileocecal valve, appendiceal orifice, and rectum                            were photographed. Scope In: 1:31:11 PM Scope Out: 1:52:20 PM Scope Withdrawal Time: 0 hours 16 minutes 7 seconds  Total Procedure Duration: 0 hours 21 minutes 9 seconds  Findings:                 The perianal and digital rectal examinations were                            normal.                           Three sessile polyps were found in the rectum and                            sigmoid colon. The polyps were 1 to 2 mm in size.                            These polyps were removed with a cold biopsy                            forceps. Resection and retrieval were complete.  Estimated blood loss was minimal.                           The sigmoid colon and ascending colon were                            moderately tortuous. External pressure was applied                            to the patient's abdomen in order to accomplish the                            maneuver.                           The exam was otherwise normal throughout the                            examined colon.                           Aside from the small rectal polyp, the retroflexed                            view of the distal rectum and anal verge was normal                            and showed no anal or rectal abnormalities. Complications:            No immediate complications. Estimated Blood Loss:     Estimated blood loss was minimal. Impression:               - Three 1 to 2 mm polyps in the rectum and in the                            sigmoid colon, removed with a cold biopsy  forceps.                            Resected and retrieved.                           - Tortuous colon.                           - The distal rectum and anal verge are normal on                            retroflexion view. Recommendation:           - Patient has a contact number available for                            emergencies. The signs and symptoms of potential                            delayed complications were  discussed with the                            patient. Return to normal activities tomorrow.                            Written discharge instructions were provided to the                            patient.                           - Resume previous diet today.                           - Continue present medications.                           - Await pathology results.                           - Repeat colonoscopy in 5-10 years for surveillance                            based on pathology results.                           - Return to GI office PRN. Gerrit Heck, MD 07/16/2018 1:59:29 PM

## 2018-07-16 NOTE — Progress Notes (Signed)
Report given to PACU, vss 

## 2018-07-16 NOTE — Patient Instructions (Signed)
Handouts given on Polyps.  YOU HAD AN ENDOSCOPIC PROCEDURE TODAY AT [facility]:   Refer to the procedure report that was given to you for any specific questions about what was found during the examination.  If the procedure report does not answer your questions, please call your gastroenterologist to clarify.  If you requested that your care partner not be given the details of your procedure findings, then the procedure report has been included in a sealed envelope for you to review at your convenience later.  YOU SHOULD EXPECT: Some feelings of bloating in the abdomen. Passage of more gas than usual.  Walking can help get rid of the air that was put into your GI tract during the procedure and reduce the bloating. If you had a lower endoscopy (such as a colonoscopy or flexible sigmoidoscopy) you may notice spotting of blood in your stool or on the toilet paper. If you underwent a bowel prep for your procedure, you may not have a normal bowel movement for a few days.  Please Note:  You might notice some irritation and congestion in your nose or some drainage.  This is from the oxygen used during your procedure.  There is no need for concern and it should clear up in a day or so.  SYMPTOMS TO REPORT IMMEDIATELY:   Following lower endoscopy (colonoscopy or flexible sigmoidoscopy):  Excessive amounts of blood in the stool  Significant tenderness or worsening of abdominal pains  Swelling of the abdomen that is new, acute  Fever of 100F or higher   Black, tarry-looking stools  For urgent or emergent issues, a gastroenterologist can be reached at any hour by calling [phone].   DIET:  We do recommend a small meal at first, but then you may proceed to your regular diet.  Drink plenty of fluids but you should avoid alcoholic beverages for 24 hours.  ACTIVITY:  You should plan to take it easy for the rest of today and you should NOT DRIVE or use heavy machinery until tomorrow  (because of the sedation medicines used during the test).    FOLLOW UP: Our staff will call the number listed on your records the next business day following your procedure to check on you and address any questions or concerns that you may have regarding the information given to you following your procedure. If we do not reach you, we will leave a message.  However, if you are feeling well and you are not experiencing any problems, there is no need to return our call.  We will assume that you have returned to your regular daily activities without incident.  If any biopsies were taken you will be contacted by phone or by letter within the next 1-3 weeks.  Please call us at [phone] if you have not heard about the biopsies in 3 weeks.    SIGNATURES/CONFIDENTIALITY: You and/or your care partner have signed paperwork which will be entered into your electronic medical record.  These signatures attest to the fact that that the information above on your After Visit Summary has been reviewed and is understood.  Full responsibility of the confidentiality of this discharge information lies with you and/or your care-partner.

## 2018-07-16 NOTE — Progress Notes (Signed)
Called to room to assist during endoscopic procedure.  Patient ID and intended procedure confirmed with present staff. Received instructions for my participation in the procedure from the performing physician.  

## 2018-07-16 NOTE — Progress Notes (Signed)
Pt's states no medical or surgical changes since previsit or office visit. 

## 2018-07-19 ENCOUNTER — Telehealth: Payer: Self-pay

## 2018-07-19 NOTE — Telephone Encounter (Signed)
Left message on answering machine. 

## 2018-07-22 ENCOUNTER — Encounter: Payer: Self-pay | Admitting: Gastroenterology

## 2018-08-03 ENCOUNTER — Encounter: Payer: Self-pay | Admitting: Family Medicine

## 2018-08-03 ENCOUNTER — Ambulatory Visit (INDEPENDENT_AMBULATORY_CARE_PROVIDER_SITE_OTHER): Payer: PPO | Admitting: Family Medicine

## 2018-08-03 ENCOUNTER — Encounter: Payer: PPO | Admitting: Family Medicine

## 2018-08-03 VITALS — BP 138/82 | HR 64 | Temp 97.6°F | Ht 62.0 in | Wt 126.0 lb

## 2018-08-03 DIAGNOSIS — Z0000 Encounter for general adult medical examination without abnormal findings: Secondary | ICD-10-CM | POA: Diagnosis not present

## 2018-08-03 NOTE — Progress Notes (Signed)
Subjective:     Sandra Dickerson is a 68 y.o. female and is here for a comprehensive physical exam. The patient reports no problems.  Social History   Socioeconomic History  . Marital status: Married    Spouse name: Not on file  . Number of children: Not on file  . Years of education: Not on file  . Highest education level: Not on file  Occupational History    Comment: embroidery   Social Needs  . Financial resource strain: Not on file  . Food insecurity:    Worry: Not on file    Inability: Not on file  . Transportation needs:    Medical: Not on file    Non-medical: Not on file  Tobacco Use  . Smoking status: Never Smoker  . Smokeless tobacco: Never Used  Substance and Sexual Activity  . Alcohol use: Yes    Alcohol/week: 0.0 standard drinks    Comment: occ  . Drug use: Never  . Sexual activity: Not Currently    Partners: Male  Lifestyle  . Physical activity:    Days per week: Not on file    Minutes per session: Not on file  . Stress: Not on file  Relationships  . Social connections:    Talks on phone: Not on file    Gets together: Not on file    Attends religious service: Not on file    Active member of club or organization: Not on file    Attends meetings of clubs or organizations: Not on file    Relationship status: Not on file  . Intimate partner violence:    Fear of current or ex partner: Not on file    Emotionally abused: Not on file    Physically abused: Not on file    Forced sexual activity: Not on file  Other Topics Concern  . Not on file  Social History Narrative   Exercise-- gym    Health Maintenance  Topic Date Due  . MAMMOGRAM  11/05/2018  . PNA vac Low Risk Adult (2 of 2 - PPSV23) 05/01/2019  . TETANUS/TDAP  01/15/2025  . COLONOSCOPY  07/16/2028  . INFLUENZA VACCINE  Completed  . DEXA SCAN  Completed  . Hepatitis C Screening  Completed    The following portions of the patient's history were reviewed and updated as appropriate:  She  has a  past medical history of Allergy and Cancer ([place]). She does not have any pertinent problems on file. She  has a past surgical history that includes Knee surgery (Right); Nose surgery; Nasal septum surgery; and basal cell removal (Right, 11/07/2017). Her family history includes Breast cancer in her maternal aunt and maternal grandmother; CAD in her father; Heart disease in her father and mother; Hyperlipidemia in her father; Hypertension in her father and mother; Kidney disease in her father. She  reports that she has never smoked. She has never used smokeless tobacco. She reports current alcohol use. She reports that she does not use drugs. She has a current medication list which includes the following prescription(s): cholecalciferol, glucosamine-chondroitin, magnesium, multivitamin, fish oil, and OVER THE COUNTER MEDICATION. Current Outpatient Medications on File Prior to Visit  Medication Sig Dispense Refill  . cholecalciferol (VITAMIN D) 1000 UNITS tablet Take 1,000 Units by mouth daily.    Marland Kitchen GLUCOSAMINE-CHONDROITIN PO Take 1 capsule by mouth daily.    Marland Kitchen MAGNESIUM PO Take 1 tablet by mouth daily.    . Multiple Vitamin (MULTIVITAMIN) tablet Take 1 tablet  by mouth daily.    . Omega-3 Fatty Acids (FISH OIL) 1000 MG CAPS Take 1 capsule by mouth daily.    Marland Kitchen OVER THE COUNTER MEDICATION Calcium, one capsule daily.     No current facility-administered medications on file prior to visit.    She is allergic to codeine..  Review of Systems Review of Systems  Constitutional: Negative for activity change, appetite change and fatigue.  HENT: Negative for hearing loss, congestion, tinnitus and ear discharge.  dentist q66m Eyes: Negative for visual disturbance (see optho q1y -- vision corrected to 20/20 with glasses).  Respiratory: Negative for cough, chest tightness and shortness of breath.   Cardiovascular: Negative for chest pain, palpitations and leg swelling.  Gastrointestinal: Negative for  abdominal pain, diarrhea, constipation and abdominal distention.  Genitourinary: Negative for urgency, frequency, decreased urine volume and difficulty urinating.  Musculoskeletal: Negative for back pain, arthralgias and gait problem.  Skin: Negative for color change, pallor and rash.  Neurological: Negative for dizziness, light-headedness, numbness and headaches.  Hematological: Negative for adenopathy. Does not bruise/bleed easily.  Psychiatric/Behavioral: Negative for suicidal ideas, confusion, sleep disturbance, self-injury, dysphoric mood, decreased concentration and agitation.      Objective:    BP 138/82   Pulse 64   Temp 97.6 F (36.4 C)   Ht 5\' 2"  (1.575 m)   Wt 126 lb (57.2 kg)   SpO2 97%   BMI 23.05 kg/m  General appearance: alert, cooperative, appears stated age and no distress Head: Normocephalic, without obvious abnormality, atraumatic Eyes: conjunctivae/corneas clear. PERRL, EOM's intact. Fundi benign. Ears: normal TM's and external ear canals both ears Nose: Nares normal. Septum midline. Mucosa normal. No drainage or sinus tenderness. Throat: lips, mucosa, and tongue normal; teeth and gums normal Neck: no adenopathy, no carotid bruit, no JVD, supple, symmetrical, trachea midline and thyroid not enlarged, symmetric, no tenderness/mass/nodules Back: symmetric, no curvature. ROM normal. No CVA tenderness. Lungs: clear to auscultation bilaterally Breasts: normal appearance, no masses or tenderness Heart: regular rate and rhythm, S1, S2 normal, no murmur, click, rub or gallop Abdomen: soft, non-tender; bowel sounds normal; no masses,  no organomegaly Pelvic: deferred--gyn Extremities: extremities normal, atraumatic, no cyanosis or edema Pulses: 2+ and symmetric Skin: Skin color, texture, turgor normal. No rashes or lesions Lymph nodes: Cervical, supraclavicular, and axillary nodes normal. Neurologic: Alert and oriented X 3, normal strength and tone. Normal symmetric  reflexes. Normal coordination and gait    Assessment:    Healthy female exam.      Plan:    ghm utd Check labs  See After Visit Summary for Counseling Recommendations    There are no diagnoses linked to this encounter.

## 2018-08-03 NOTE — Patient Instructions (Signed)
Preventive Care 67 Years and Older, Female Preventive care refers to lifestyle choices and visits with your health care provider that can promote health and wellness. What does preventive care include?  A yearly physical exam. This is also called an annual well check.  Dental exams once or twice a year.  Routine eye exams. Ask your health care provider how often you should have your eyes checked.  Personal lifestyle choices, including: ? Daily care of your teeth and gums. ? Regular physical activity. ? Eating a healthy diet. ? Avoiding tobacco and drug use. ? Limiting alcohol use. ? Practicing safe sex. ? Taking low-dose aspirin every day. ? Taking vitamin and mineral supplements as recommended by your health care provider. What happens during an annual well check? The services and screenings done by your health care provider during your annual well check will depend on your age, overall health, lifestyle risk factors, and family history of disease. Counseling Your health care provider may ask you questions about your:  Alcohol use.  Tobacco use.  Drug use.  Emotional well-being.  Home and relationship well-being.  Sexual activity.  Eating habits.  History of falls.  Memory and ability to understand (cognition).  Work and work Statistician.  Reproductive health.  Screening You may have the following tests or measurements:  Height, weight, and BMI.  Blood pressure.  Lipid and cholesterol levels. These may be checked every 5 years, or more frequently if you are over 30 years old.  Skin check.  Lung cancer screening. You may have this screening every year starting at age 27 if you have a 30-pack-year history of smoking and currently smoke or have quit within the past 15 years.  Colorectal cancer screening. All adults should have this screening starting at age 33 and continuing until age 46. You will have tests every 1-10 years, depending on your results and the  type of screening test. People at increased risk should start screening at an earlier age. Screening tests may include: ? Guaiac-based fecal occult blood testing. ? Fecal immunochemical test (FIT). ? Stool DNA test. ? Virtual colonoscopy. ? Sigmoidoscopy. During this test, a flexible tube with a tiny camera (sigmoidoscope) is used to examine your rectum and lower colon. The sigmoidoscope is inserted through your anus into your rectum and lower colon. ? Colonoscopy. During this test, a long, thin, flexible tube with a tiny camera (colonoscope) is used to examine your entire colon and rectum.  Hepatitis C blood test.  Hepatitis B blood test.  Sexually transmitted disease (STD) testing.  Diabetes screening. This is done by checking your blood sugar (glucose) after you have not eaten for a while (fasting). You may have this done every 1-3 years.  Bone density scan. This is done to screen for osteoporosis. You may have this done starting at age 37.  Mammogram. This may be done every 1-2 years. Talk to your health care provider about how often you should have regular mammograms. Talk with your health care provider about your test results, treatment options, and if necessary, the need for more tests. Vaccines Your health care provider may recommend certain vaccines, such as:  Influenza vaccine. This is recommended every year.  Tetanus, diphtheria, and acellular pertussis (Tdap, Td) vaccine. You may need a Td booster every 10 years.  Varicella vaccine. You may need this if you have not been vaccinated.  Zoster vaccine. You may need this after age 38.  Measles, mumps, and rubella (MMR) vaccine. You may need at least  one dose of MMR if you were born in 1957 or later. You may also need a second dose.  Pneumococcal 13-valent conjugate (PCV13) vaccine. One dose is recommended after age 24.  Pneumococcal polysaccharide (PPSV23) vaccine. One dose is recommended after age 24.  Meningococcal  vaccine. You may need this if you have certain conditions.  Hepatitis A vaccine. You may need this if you have certain conditions or if you travel or work in places where you may be exposed to hepatitis A.  Hepatitis B vaccine. You may need this if you have certain conditions or if you travel or work in places where you may be exposed to hepatitis B.  Haemophilus influenzae type b (Hib) vaccine. You may need this if you have certain conditions. Talk to your health care provider about which screenings and vaccines you need and how often you need them. This information is not intended to replace advice given to you by your health care provider. Make sure you discuss any questions you have with your health care provider. Document Released: 06/22/2015 Document Revised: 07/16/2017 Document Reviewed: 03/27/2015 Elsevier Interactive Patient Education  2019 [employer].

## 2018-12-22 DIAGNOSIS — I8393 Asymptomatic varicose veins of bilateral lower extremities: Secondary | ICD-10-CM | POA: Diagnosis not present

## 2018-12-22 DIAGNOSIS — D1801 Hemangioma of skin and subcutaneous tissue: Secondary | ICD-10-CM | POA: Diagnosis not present

## 2018-12-22 DIAGNOSIS — L821 Other seborrheic keratosis: Secondary | ICD-10-CM | POA: Diagnosis not present

## 2018-12-22 DIAGNOSIS — Z85828 Personal history of other malignant neoplasm of skin: Secondary | ICD-10-CM | POA: Diagnosis not present

## 2018-12-22 DIAGNOSIS — D229 Melanocytic nevi, unspecified: Secondary | ICD-10-CM | POA: Diagnosis not present

## 2018-12-22 DIAGNOSIS — L814 Other melanin hyperpigmentation: Secondary | ICD-10-CM | POA: Diagnosis not present

## 2019-04-22 ENCOUNTER — Other Ambulatory Visit: Payer: Self-pay

## 2019-04-22 ENCOUNTER — Ambulatory Visit (INDEPENDENT_AMBULATORY_CARE_PROVIDER_SITE_OTHER): Payer: PPO

## 2019-04-22 DIAGNOSIS — Z23 Encounter for immunization: Secondary | ICD-10-CM | POA: Diagnosis not present

## 2019-04-28 ENCOUNTER — Ambulatory Visit (INDEPENDENT_AMBULATORY_CARE_PROVIDER_SITE_OTHER): Payer: PPO | Admitting: *Deleted

## 2019-04-28 ENCOUNTER — Other Ambulatory Visit: Payer: Self-pay

## 2019-04-28 DIAGNOSIS — Z23 Encounter for immunization: Secondary | ICD-10-CM

## 2019-04-28 NOTE — Progress Notes (Signed)
Patient here for pnuemovax vaccine.  Vaccine given in left deltoid and patient tolerated well.

## 2019-06-27 ENCOUNTER — Telehealth: Payer: Self-pay | Admitting: Family Medicine

## 2019-06-27 NOTE — Telephone Encounter (Signed)
Left message for patient to call office back to scheduled Medicare Annual Wellness.   Last AWV 06/29/2018; please scheduled on or after 06/29/19 with [place], RN at [facility].

## 2019-06-29 DIAGNOSIS — L821 Other seborrheic keratosis: Secondary | ICD-10-CM | POA: Diagnosis not present

## 2019-06-29 DIAGNOSIS — L814 Other melanin hyperpigmentation: Secondary | ICD-10-CM | POA: Diagnosis not present

## 2019-06-29 DIAGNOSIS — I8393 Asymptomatic varicose veins of bilateral lower extremities: Secondary | ICD-10-CM | POA: Diagnosis not present

## 2019-06-29 DIAGNOSIS — D229 Melanocytic nevi, unspecified: Secondary | ICD-10-CM | POA: Diagnosis not present

## 2019-06-29 DIAGNOSIS — D1801 Hemangioma of skin and subcutaneous tissue: Secondary | ICD-10-CM | POA: Diagnosis not present

## 2019-06-29 DIAGNOSIS — Z85828 Personal history of other malignant neoplasm of skin: Secondary | ICD-10-CM | POA: Diagnosis not present

## 2019-06-29 DIAGNOSIS — L905 Scar conditions and fibrosis of skin: Secondary | ICD-10-CM | POA: Diagnosis not present

## 2019-07-12 DIAGNOSIS — Z1231 Encounter for screening mammogram for malignant neoplasm of breast: Secondary | ICD-10-CM | POA: Diagnosis not present

## 2019-07-12 LAB — HM MAMMOGRAPHY

## 2019-07-15 DIAGNOSIS — R928 Other abnormal and inconclusive findings on diagnostic imaging of breast: Secondary | ICD-10-CM | POA: Diagnosis not present

## 2019-07-15 DIAGNOSIS — N6001 Solitary cyst of right breast: Secondary | ICD-10-CM | POA: Diagnosis not present

## 2019-08-02 ENCOUNTER — Encounter: Payer: Self-pay | Admitting: *Deleted

## 2020-07-17 DIAGNOSIS — Z1231 Encounter for screening mammogram for malignant neoplasm of breast: Secondary | ICD-10-CM | POA: Diagnosis not present

## 2020-07-17 LAB — HM MAMMOGRAPHY

## 2021-01-16 DIAGNOSIS — L814 Other melanin hyperpigmentation: Secondary | ICD-10-CM | POA: Diagnosis not present

## 2021-01-16 DIAGNOSIS — I8393 Asymptomatic varicose veins of bilateral lower extremities: Secondary | ICD-10-CM | POA: Diagnosis not present

## 2021-01-16 DIAGNOSIS — Z85828 Personal history of other malignant neoplasm of skin: Secondary | ICD-10-CM | POA: Diagnosis not present

## 2021-01-16 DIAGNOSIS — L570 Actinic keratosis: Secondary | ICD-10-CM | POA: Diagnosis not present

## 2021-01-16 DIAGNOSIS — D229 Melanocytic nevi, unspecified: Secondary | ICD-10-CM | POA: Diagnosis not present

## 2021-01-16 DIAGNOSIS — L905 Scar conditions and fibrosis of skin: Secondary | ICD-10-CM | POA: Diagnosis not present

## 2021-01-16 DIAGNOSIS — L718 Other rosacea: Secondary | ICD-10-CM | POA: Diagnosis not present

## 2021-01-16 DIAGNOSIS — L821 Other seborrheic keratosis: Secondary | ICD-10-CM | POA: Diagnosis not present

## 2021-07-23 DIAGNOSIS — Z1231 Encounter for screening mammogram for malignant neoplasm of breast: Secondary | ICD-10-CM | POA: Diagnosis not present

## 2021-07-23 LAB — HM MAMMOGRAPHY

## 2022-04-30 NOTE — Progress Notes (Signed)
Referring-Sandra Lowne Chase DO Reason for referral-family history of coronary artery disease  HPI: 71 year old female for evaluation of family history of coronary artery disease at request of Roma Schanz DO.  Patient states both of her parents died from congestive heart failure.  She had a brother who died recently in the sleep from myocardial infarction by report.  She therefore presents for further evaluation.  She has dyspnea with more vigorous activities but not routine activities.  No orthopnea, PND, pedal edema, claudication, exertional chest pain or syncope.  Current Outpatient Medications  Medication Sig Dispense Refill   cholecalciferol (VITAMIN D) 1000 UNITS tablet Take 1,000 Units by mouth daily.     GLUCOSAMINE-CHONDROITIN PO Take 1 capsule by mouth daily.     MAGNESIUM PO Take 1 tablet by mouth daily.     Multiple Vitamin (MULTIVITAMIN) tablet Take 1 tablet by mouth daily.     Omega-3 Fatty Acids (FISH OIL) 1000 MG CAPS Take 1 capsule by mouth daily.     OVER THE COUNTER MEDICATION Calcium, one capsule daily.     No current facility-administered medications for this visit.    Allergies  Allergen Reactions   Codeine Nausea And Vomiting     Past Medical History:  Diagnosis Date   Allergy    Cancer ([place])    Basal cell skin cancer   Hyperlipidemia     Past Surgical History:  Procedure Laterality Date   basal cell removal Right 11/07/2017   right flank   KNEE SURGERY Right    torn meniscus   NASAL SEPTUM SURGERY     NOSE SURGERY     polyp removed   TONSILLECTOMY      Social History   Socioeconomic History   Marital status: Married    Spouse name: Not on file   Number of children: Not on file   Years of education: Not on file   Highest education level: Not on file  Occupational History    Comment: embroidery   Tobacco Use   Smoking status: Never   Smokeless tobacco: Never  Vaping Use   Vaping Use: Never used  Substance and Sexual  Activity   Alcohol use: Yes    Alcohol/week: 0.0 standard drinks of alcohol    Comment: occ   Drug use: Never   Sexual activity: Not Currently    Partners: Male  Other Topics Concern   Not on file  Social History Narrative   Exercise-- gym    Social Determinants of Health   Financial Resource Strain: Not on file  Food Insecurity: Not on file  Transportation Needs: Not on file  Physical Activity: Not on file  Stress: Not on file  Social Connections: Not on file  Intimate Partner Violence: Not on file    Family History  Problem Relation Age of Onset   Hypertension Mother    Heart disease Mother        CHF   Hypertension Father    Heart disease Father        chf   Kidney disease Father    Hyperlipidemia Father    CAD Father    Breast cancer Maternal Aunt    Breast cancer Maternal Grandmother    Colon cancer Neg Hx    Esophageal cancer Neg Hx    Stomach cancer Neg Hx    Rectal cancer Neg Hx     ROS: Some pain from previous knee issue but no fevers or chills, productive cough, hemoptysis,  dysphasia, odynophagia, melena, hematochezia, dysuria, hematuria, rash, seizure activity, orthopnea, PND, pedal edema, claudication. Remaining systems are negative.  Physical Exam:   Blood pressure (!) 148/88, pulse 64, height '5\' 2"'$  (1.575 m), weight 130 lb (59 kg).  General:  Well developed/well nourished in NAD Skin warm/dry Patient not depressed No peripheral clubbing Back-normal HEENT-normal/normal eyelids Neck supple/normal carotid upstroke bilaterally; no bruits; no JVD; no thyromegaly chest - CTA/ normal expansion CV - RRR/normal S1 and S2; no murmurs, rubs or gallops;  PMI nondisplaced Abdomen -NT/ND, no HSM, no mass, + bowel sounds, no bruit 2+ femoral pulses, no bruits Ext-no edema, chords, 2+ DP Neuro-grossly nonfocal  ECG -normal sinus rhythm at a rate of 64, low voltage, nonspecific ST changes, left axis deviation.  Personally reviewed  A/P  1 family history  of coronary artery disease-we will arrange calcium score for risk stratification.  If elevated she will need statin therapy.  2 dyspnea-some dyspnea on exertion.  Will arrange echocardiogram to assess LV function.  3 hyperlipidemia-check baseline lipids.  If she has elevated calcium score we will initiate statin therapy regardless.  Kirk Ruths, MD

## 2022-05-14 ENCOUNTER — Ambulatory Visit: Payer: PPO | Attending: Cardiology | Admitting: Cardiology

## 2022-05-14 ENCOUNTER — Encounter: Payer: Self-pay | Admitting: Cardiology

## 2022-05-14 VITALS — BP 148/88 | HR 64 | Ht 62.0 in | Wt 130.0 lb

## 2022-05-14 DIAGNOSIS — R0609 Other forms of dyspnea: Secondary | ICD-10-CM

## 2022-05-14 DIAGNOSIS — E785 Hyperlipidemia, unspecified: Secondary | ICD-10-CM

## 2022-05-14 DIAGNOSIS — Z8249 Family history of ischemic heart disease and other diseases of the circulatory system: Secondary | ICD-10-CM

## 2022-05-14 NOTE — Patient Instructions (Signed)
  Lab Work:  Your physician recommends that you return for lab work [employer]  Located on the 2 nd floor in [address] Hours- Mon-Thur 8 am-11:30 am and 1 pm - 4:30 pm             Fri-8am - 11 am   If you have labs (blood work) drawn today and your tests are completely normal, you will receive your results only by: [place] (if you have MyChart) OR A paper copy in the mail If you have any lab test that is abnormal or we need to change your treatment, we will call you to review the results.   Testing/Procedures:  Your physician has requested that you have an echocardiogram. Echocardiography is a painless test that uses sound waves to create images of your heart. It provides your doctor with information about the size and shape of your heart and how well your heart's chambers and valves are working. This procedure takes approximately one hour. There are no restrictions for this procedure. Please do NOT wear cologne, perfume, aftershave, or lotions (deodorant is allowed). Please arrive 15 minutes prior to your appointment time. [place] [facility]CI[facility] SCOR[facility] CT SCAN AT [facility]   Follow-Up: At [facility], you and your health needs are our priority.  As part of our continuing mission to provide you with exceptional heart care, we have created designated Provider Care Teams.  These Care Teams include your primary Cardiologist (physician) and Advanced Practice Providers (APPs -  Physician Assistants and Nurse Practitioners) who all work together to provide you with the care you need, when you need it.  We recommend signing up for the patient portal called "MyChart".  Sign up information is provided on this After Visit Summary.  MyChart is used to connect with patients for Virtual Visits (Telemedicine).  Patients are able to view lab/test results, encounter notes, upcoming appointments, etc.  Non-urgent  messages can be sent to your provider as well.   To learn more about what you can do with MyChart, go to [email or web address].    Your next appointment:   12 month(s)  The format for your next appointment:   In Person  Provider:   Kirk Ruths, MD

## 2022-05-16 DIAGNOSIS — Z8249 Family history of ischemic heart disease and other diseases of the circulatory system: Secondary | ICD-10-CM | POA: Diagnosis not present

## 2022-05-16 DIAGNOSIS — E785 Hyperlipidemia, unspecified: Secondary | ICD-10-CM | POA: Diagnosis not present

## 2022-05-16 DIAGNOSIS — R0609 Other forms of dyspnea: Secondary | ICD-10-CM | POA: Diagnosis not present

## 2022-05-17 LAB — LIPID PANEL
Chol/HDL Ratio: 2.6 ratio (ref 0.0–4.4)
Cholesterol, Total: 191 mg/dL (ref 100–199)
HDL: 73 mg/dL (ref >39)
LDL Chol Calc (NIH): 107 mg/dL — ABNORMAL HIGH (ref 0–99)
Triglycerides: 56 mg/dL (ref 0–149)
VLDL Cholesterol Cal: 11 mg/dL (ref 5–40)

## 2022-06-11 ENCOUNTER — Ambulatory Visit (HOSPITAL_BASED_OUTPATIENT_CLINIC_OR_DEPARTMENT_OTHER)
Admission: RE | Admit: 2022-06-11 | Discharge: 2022-06-11 | Disposition: A | Discharge status: Home / Self Care | Payer: PPO | Source: Ambulatory Visit | Attending: Cardiology | Admitting: Cardiology

## 2022-06-11 DIAGNOSIS — R0609 Other forms of dyspnea: Secondary | ICD-10-CM | POA: Insufficient documentation

## 2022-06-11 DIAGNOSIS — E785 Hyperlipidemia, unspecified: Secondary | ICD-10-CM | POA: Insufficient documentation

## 2022-06-12 ENCOUNTER — Other Ambulatory Visit (HOSPITAL_COMMUNITY): Payer: PPO

## 2022-06-12 ENCOUNTER — Telehealth: Payer: Self-pay | Admitting: Cardiology

## 2022-06-12 ENCOUNTER — Ambulatory Visit (HOSPITAL_COMMUNITY): Payer: PPO | Attending: Cardiology

## 2022-06-12 DIAGNOSIS — R0609 Other forms of dyspnea: Secondary | ICD-10-CM | POA: Diagnosis not present

## 2022-06-12 DIAGNOSIS — E785 Hyperlipidemia, unspecified: Secondary | ICD-10-CM

## 2022-06-12 DIAGNOSIS — I77819 Aortic ectasia, unspecified site: Secondary | ICD-10-CM

## 2022-06-12 LAB — ECHOCARDIOGRAM COMPLETE
Area-P 1/2: 1.6 cm2
S' Lateral: 3.1 cm

## 2022-06-12 MED ORDER — ROSUVASTATIN CALCIUM 40 MG PO TABS: 40.0000 mg | ORAL_TABLET | Freq: Every day | ORAL | 3 refills | Status: DC

## 2022-06-12 MED ORDER — ASPIRIN 81 MG PO TBEC: 81.0000 mg | DELAYED_RELEASE_TABLET | Freq: Every day | ORAL | 3 refills | Status: AC

## 2022-06-12 NOTE — Telephone Encounter (Signed)
  Lelon Perla, MD 06/12/2022 10:26 AM EST     FU cta of thoracic aorta one year [place], MD 06/12/2022  7:18 AM EST     Ca score mildly elevated; add crestor 40 mg daily; lipids and liver 8 weeks Add ASA 81 mg daily [place], MD 06/12/2022  3:16 PM EST     Normal LV function [place] with patient regarding all the above results. Patient is aware and verbalized understanding of all instructions. Medications sent to patient preferred pharmacy. Lab orders placed. Future CTA Aorta placed for 1 year. Advised patient to call back to office with any issues, questions, or concerns. Patient verbalized understanding.

## 2022-06-12 NOTE — Telephone Encounter (Signed)
Patient was returning call for results. Please advise °

## 2022-06-13 ENCOUNTER — Other Ambulatory Visit: Payer: Self-pay | Admitting: *Deleted

## 2022-06-13 DIAGNOSIS — E785 Hyperlipidemia, unspecified: Secondary | ICD-10-CM

## 2022-06-13 NOTE — Telephone Encounter (Signed)
Lab orders mailed to the pt.  

## 2022-08-05 DIAGNOSIS — Z1231 Encounter for screening mammogram for malignant neoplasm of breast: Secondary | ICD-10-CM | POA: Diagnosis not present

## 2022-08-05 LAB — HM MAMMOGRAPHY

## 2022-10-13 DIAGNOSIS — E785 Hyperlipidemia, unspecified: Secondary | ICD-10-CM | POA: Diagnosis not present

## 2022-10-14 LAB — LIPID PANEL
Chol/HDL Ratio: 1.9 ratio (ref 0.0–4.4)
Cholesterol, Total: 117 mg/dL (ref 100–199)
HDL: 62 mg/dL (ref >39)
LDL Chol Calc (NIH): 41 mg/dL (ref 0–99)
Triglycerides: 64 mg/dL (ref 0–149)
VLDL Cholesterol Cal: 14 mg/dL (ref 5–40)

## 2022-10-14 LAB — HEPATIC FUNCTION PANEL
ALT: 42 IU/L — ABNORMAL HIGH (ref 0–32)
AST: 46 IU/L — ABNORMAL HIGH (ref 0–40)
Albumin: 4.9 g/dL (ref 3.9–4.9)
Alkaline Phosphatase: 67 IU/L (ref 44–121)
Bilirubin Total: 0.9 mg/dL (ref 0.0–1.2)
Bilirubin, Direct: 0.25 mg/dL (ref 0.00–0.40)
Total Protein: 6.9 g/dL (ref 6.0–8.5)

## 2022-10-15 ENCOUNTER — Telehealth: Payer: Self-pay | Admitting: Cardiology

## 2022-10-15 DIAGNOSIS — E785 Hyperlipidemia, unspecified: Secondary | ICD-10-CM

## 2022-10-15 MED ORDER — ROSUVASTATIN CALCIUM 20 MG PO TABS: 20.0000 mg | ORAL_TABLET | Freq: Every day | ORAL | 1 refills | Status: DC

## 2022-10-15 NOTE — Telephone Encounter (Signed)
Please return call to patient's cell phone at [phone].

## 2022-10-15 NOTE — Telephone Encounter (Signed)
  Freddi Starr, RN 10/14/2022  9:01 AM EDT Back to Top    Left message for pt to call   Lewayne Bunting, MD 10/14/2022  7:18 AM EDT     Liver functions mildly elevated.  Decrease Crestor to 20 mg daily.  Check lipids and liver in 8 weeks. Olga Millers    Patient called w/results. She will cut 40mg  crestor tabs in half. Lab order(s) mailed. Rx for crestor 20mg  sent to pharmacy w/note to hold until she requests a fill.

## 2022-10-15 NOTE — Telephone Encounter (Signed)
Patient is returning call to discuss lab results. 

## 2022-12-31 DIAGNOSIS — E785 Hyperlipidemia, unspecified: Secondary | ICD-10-CM | POA: Diagnosis not present

## 2023-01-01 LAB — HEPATIC FUNCTION PANEL
ALT: 31 IU/L (ref 0–32)
AST: 37 IU/L (ref 0–40)
Albumin: 4.6 g/dL (ref 3.9–4.9)
Alkaline Phosphatase: 70 IU/L (ref 44–121)
Bilirubin Total: 0.9 mg/dL (ref 0.0–1.2)
Bilirubin, Direct: 0.22 mg/dL (ref 0.00–0.40)
Total Protein: 6.8 g/dL (ref 6.0–8.5)

## 2023-01-01 LAB — LIPID PANEL
Chol/HDL Ratio: 2.2 ratio (ref 0.0–4.4)
Cholesterol, Total: 113 mg/dL (ref 100–199)
HDL: 51 mg/dL (ref >39)
LDL Chol Calc (NIH): 44 mg/dL (ref 0–99)
Triglycerides: 91 mg/dL (ref 0–149)
VLDL Cholesterol Cal: 18 mg/dL (ref 5–40)

## 2023-05-13 ENCOUNTER — Encounter (HOSPITAL_BASED_OUTPATIENT_CLINIC_OR_DEPARTMENT_OTHER): Payer: Self-pay

## 2023-05-13 ENCOUNTER — Ambulatory Visit (HOSPITAL_BASED_OUTPATIENT_CLINIC_OR_DEPARTMENT_OTHER)
Admission: RE | Admit: 2023-05-13 | Discharge: 2023-05-13 | Disposition: A | Discharge status: Home / Self Care | Payer: PPO | Source: Ambulatory Visit | Attending: Cardiology | Admitting: Cardiology

## 2023-05-13 DIAGNOSIS — I77819 Aortic ectasia, unspecified site: Secondary | ICD-10-CM | POA: Diagnosis not present

## 2023-05-13 DIAGNOSIS — I77810 Thoracic aortic ectasia: Secondary | ICD-10-CM | POA: Diagnosis not present

## 2023-05-13 MED ORDER — IOHEXOL 350 MG/ML SOLN
100.0000 mL | Freq: Once | INTRAVENOUS | Status: AC | PRN
  Administered 2023-05-13: 80 mL via INTRAVENOUS

## 2023-05-19 ENCOUNTER — Other Ambulatory Visit: Payer: Self-pay | Admitting: *Deleted

## 2023-05-19 DIAGNOSIS — I77819 Aortic ectasia, unspecified site: Secondary | ICD-10-CM

## 2023-08-03 ENCOUNTER — Other Ambulatory Visit: Payer: Self-pay

## 2023-08-03 MED ORDER — ROSUVASTATIN CALCIUM 20 MG PO TABS: 20.0000 mg | ORAL_TABLET | Freq: Every day | ORAL | 1 refills | Status: DC

## 2023-08-03 NOTE — Telephone Encounter (Signed)
This is a [facility] pt 

## 2023-08-11 DIAGNOSIS — Z1231 Encounter for screening mammogram for malignant neoplasm of breast: Secondary | ICD-10-CM | POA: Diagnosis not present

## 2023-08-11 LAB — HM MAMMOGRAPHY

## 2023-11-24 NOTE — Progress Notes (Signed)
 HPI: Follow-up coronary calcification.  CTA December 2024 showed 3.1 cm dilated aortic arch.  Calcium  score January 2024 showed mildly dilated ascending aorta at 4 cm, calcium  score 276 which was 86 percentile. Echocardiogram January 2024 showed normal LV function, grade 1 diastolic dysfunction.  Since last seen approximately 2 weeks ago patient had chest discomfort while lying in the bed.  It was substernal radiating into her lower neck and described as a tightness.  Lasted minutes and then resolved spontaneously.  No associated symptoms.  She has not had exertional chest pain.  No dyspnea or syncope.  Current Outpatient Medications  Medication Sig Dispense Refill   aspirin  EC 81 MG tablet Take 1 tablet (81 mg total) by mouth daily. Swallow whole. 90 tablet 3   cholecalciferol (VITAMIN D) 1000 UNITS tablet Take 1,000 Units by mouth daily.     GLUCOSAMINE-CHONDROITIN PO Take 1 capsule by mouth daily.     MAGNESIUM PO Take 1 tablet by mouth daily.     Multiple Vitamin (MULTIVITAMIN) tablet Take 1 tablet by mouth daily.     Omega-3 Fatty Acids (FISH OIL) 1000 MG CAPS Take 1 capsule by mouth daily.     OVER THE COUNTER MEDICATION Calcium , one capsule daily.     rosuvastatin  (CRESTOR ) 20 MG tablet Take 1 tablet (20 mg total) by mouth daily. 90 tablet 1   No current facility-administered medications for this visit.     Past Medical History:  Diagnosis Date   Allergy    Cancer (HCC)    Basal cell skin cancer   Hyperlipidemia     Past Surgical History:  Procedure Laterality Date   basal cell removal Right 11/07/2017   right flank   KNEE SURGERY Right    torn meniscus   NASAL SEPTUM SURGERY     NOSE SURGERY     polyp removed   TONSILLECTOMY      Social History   Socioeconomic History   Marital status: Married    Spouse name: Not on file   Number of children: Not on file   Years of education: Not on file   Highest education level: Not on file  Occupational History     Comment: embroidery   Tobacco Use   Smoking status: Never   Smokeless tobacco: Never  Vaping Use   Vaping status: Never Used  Substance and Sexual Activity   Alcohol use: Yes    Alcohol/week: 0.0 standard drinks of alcohol    Comment: occ   Drug use: Never   Sexual activity: Not Currently    Partners: Male  Other Topics Concern   Not on file  Social History Narrative   Exercise-- gym    Social Drivers of Health   Financial Resource Strain: Not on file  Food Insecurity: Not on file  Transportation Needs: Not on file  Physical Activity: Not on file  Stress: Not on file  Social Connections: Not on file  Intimate Partner Violence: Not on file    Family History  Problem Relation Age of Onset   Hypertension Mother    Heart disease Mother        CHF   Hypertension Father    Heart disease Father        chf   Kidney disease Father    Hyperlipidemia Father    CAD Father    Breast cancer Maternal Aunt    Breast cancer Maternal Grandmother    Colon cancer Neg Hx    Esophageal  cancer Neg Hx    Stomach cancer Neg Hx    Rectal cancer Neg Hx     ROS: no fevers or chills, productive cough, hemoptysis, dysphasia, odynophagia, melena, hematochezia, dysuria, hematuria, rash, seizure activity, orthopnea, PND, pedal edema, claudication. Remaining systems are negative.  Physical Exam: Well-developed well-nourished in no acute distress.  Skin is warm and dry.  HEENT is normal.  Neck is supple.  Chest is clear to auscultation with normal expansion.  Cardiovascular exam is regular rate and rhythm.  Abdominal exam nontender or distended. No masses palpated. Extremities show no edema. neuro grossly intact  EKG Interpretation Date/Time:  Wednesday December 02 2023 09:33:12 EDT Ventricular Rate:  72 PR Interval:  178 QRS Duration:  92 QT Interval:  362 QTC Calculation: 396 R Axis:   -52  Text Interpretation: Normal sinus rhythm Left axis deviation Incomplete right bundle branch  block ST & T wave abnormality, consider anterior ischemia Confirmed by Pietro Rogue ([id number]) on 12/02/2023 9:34:22 AM    A/P  1 coronary calcification-Continue aspirin  and statin.  2 dilated ascending aorta/aortic arch-plan follow-up CTA December 2025.  3 hyperlipidemia-continue statin. Check lipids and liver.  4 chest pain-patient had chest pain approximately 2 weeks ago while lying in bed.  Electrocardiogram shows no new ST changes.  Will arrange coronary CTA to rule out obstructive coronary disease.  Symptoms with both typical and atypical features.  Rogue Pietro, MD

## 2023-12-02 ENCOUNTER — Ambulatory Visit: Payer: PPO | Attending: Cardiology | Admitting: Cardiology

## 2023-12-02 ENCOUNTER — Encounter: Payer: Self-pay | Admitting: Cardiology

## 2023-12-02 VITALS — BP 134/82 | HR 72 | Ht 62.0 in | Wt 128.0 lb

## 2023-12-02 DIAGNOSIS — I2510 Atherosclerotic heart disease of native coronary artery without angina pectoris: Secondary | ICD-10-CM | POA: Diagnosis not present

## 2023-12-02 DIAGNOSIS — I77819 Aortic ectasia, unspecified site: Secondary | ICD-10-CM

## 2023-12-02 DIAGNOSIS — E785 Hyperlipidemia, unspecified: Secondary | ICD-10-CM | POA: Diagnosis not present

## 2023-12-02 DIAGNOSIS — R072 Precordial pain: Secondary | ICD-10-CM

## 2023-12-02 MED ORDER — METOPROLOL TARTRATE 100 MG PO TABS: ORAL_TABLET | ORAL | 0 refills | Status: AC

## 2023-12-02 NOTE — Patient Instructions (Signed)
 Medication Instructions:   NO CHANGE  *If you need a refill on your cardiac medications before your next appointment, please call your pharmacy*  Lab Work:  Your physician recommends that you return for lab work FASTING  [employer]  Located on the 3 rd floor in [address] Hours-Monday - Friday 8 am-11:30 AM and 1 pm -4 pm   If you have labs (blood work) drawn today and your tests are completely normal, you will receive your results only by: MyChart Message (if you have MyChart) OR A paper copy in the mail If you have any lab test that is abnormal or we need to change your treatment, we will call you to review the results.  Testing/Procedures:    Your cardiac CT will be scheduled at    [facility] [address] [place], [place] [address] [phone]  If scheduled at [facility], please arrive 30 minutes early for check-in and test prep.  Please follow these instructions carefully (unless otherwise directed):  An IV will be required for this test and Nitroglycerin will be given.  Hold all erectile dysfunction medications at least 3 days (72 hrs) prior to test. (Ie viagra, cialis, sildenafil, tadalafil, etc)   On the Night Before the Test: Be sure to Drink plenty of water. Do not consume any caffeinated/decaffeinated beverages or chocolate 12 hours prior to your test. Do not take any antihistamines 12 hours prior to your test.   On the Day of the Test: Drink plenty of water until 1 hour prior to the test. Do not eat any food 1 hour prior to test. You may take your regular medications prior to the test.  Take metoprolol (Lopressor) 100 MG two hours prior to test. FEMALES- please wear underwire-free bra if available, avoid dresses & tight clothing  After the Test: Drink plenty of water. After receiving IV contrast, you may experience a mild flushed feeling. This is normal. On occasion, you may experience a mild rash up to 24 hours  after the test. This is not dangerous. If this occurs, you can take Benadryl 25 mg, Zyrtec, Claritin, or Allegra and increase your fluid intake. (Patients taking Tikosyn should avoid Benadryl, and may take Zyrtec, Claritin, or Allegra) If you experience trouble breathing, this can be serious. If it is severe call 911 IMMEDIATELY. If it is mild, please call our office.  We will call to schedule your test 2-4 weeks out understanding that some insurance companies will need an authorization prior to the service being performed.   For more information and frequently asked questions, please visit our website : [email or web address]  For non-scheduling related questions, please contact the cardiac imaging nurse navigator should you have any questions/concerns: Cardiac Imaging Nurse Navigators Direct Office Dial: [phone]   For scheduling needs, including cancellations and rescheduling, please call [place], [phone].   Follow-Up: At [facility], you and your health needs are our priority.  As part of our continuing mission to provide you with exceptional heart care, our providers are all part of one team.  This team includes your primary Cardiologist (physician) and Advanced Practice Providers or APPs (Physician Assistants and Nurse Practitioners) who all work together to provide you with the care you need, when you need it.  Your next appointment:   12 month(s)  Provider:   Redell Shallow, MD

## 2023-12-03 ENCOUNTER — Ambulatory Visit: Payer: Self-pay | Admitting: *Deleted

## 2023-12-03 LAB — BASIC METABOLIC PANEL WITH GFR
BUN/Creatinine Ratio: 22 (ref 12–28)
BUN: 22 mg/dL (ref 8–27)
CO2: 23 mmol/L (ref 20–29)
Calcium: 10.5 mg/dL — ABNORMAL HIGH (ref 8.7–10.3)
Chloride: 101 mmol/L (ref 96–106)
Creatinine, Ser: 0.99 mg/dL (ref 0.57–1.00)
Glucose: 89 mg/dL (ref 70–99)
Potassium: 4.5 mmol/L (ref 3.5–5.2)
Sodium: 141 mmol/L (ref 134–144)
eGFR: 61 mL/min/{1.73_m2} (ref >59)

## 2023-12-18 ENCOUNTER — Encounter (HOSPITAL_COMMUNITY): Payer: Self-pay

## 2023-12-21 ENCOUNTER — Telehealth (HOSPITAL_COMMUNITY): Payer: Self-pay | Admitting: *Deleted

## 2023-12-21 NOTE — Telephone Encounter (Signed)
 Attempted to call patient regarding upcoming cardiac CT appointment. Left message on voicemail with name and callback number  Larey Brick RN Navigator Cardiac Imaging [facility] [phone] Office [phone] Cell

## 2023-12-22 ENCOUNTER — Ambulatory Visit (HOSPITAL_BASED_OUTPATIENT_CLINIC_OR_DEPARTMENT_OTHER)
Admission: RE | Admit: 2023-12-22 | Discharge: 2023-12-22 | Disposition: A | Discharge status: Home / Self Care | Source: Ambulatory Visit | Attending: Cardiology | Admitting: Cardiology

## 2023-12-22 ENCOUNTER — Other Ambulatory Visit: Payer: Self-pay | Admitting: Cardiology

## 2023-12-22 DIAGNOSIS — R931 Abnormal findings on diagnostic imaging of heart and coronary circulation: Secondary | ICD-10-CM | POA: Insufficient documentation

## 2023-12-22 DIAGNOSIS — R072 Precordial pain: Secondary | ICD-10-CM | POA: Diagnosis not present

## 2023-12-22 MED ORDER — IOHEXOL 350 MG/ML SOLN
100.0000 mL | Freq: Once | INTRAVENOUS | Status: AC | PRN
  Administered 2023-12-22: 95 mL via INTRAVENOUS

## 2023-12-22 MED ORDER — NITROGLYCERIN 0.4 MG SL SUBL
0.8000 mg | SUBLINGUAL_TABLET | Freq: Once | SUBLINGUAL | Status: AC
  Administered 2023-12-22: 0.8 mg via SUBLINGUAL

## 2023-12-22 NOTE — Progress Notes (Signed)
 FFR Order

## 2023-12-25 ENCOUNTER — Ambulatory Visit: Payer: Self-pay | Admitting: Cardiology

## 2024-01-06 ENCOUNTER — Encounter: Payer: Self-pay | Admitting: *Deleted

## 2024-01-21 ENCOUNTER — Encounter: Payer: Self-pay | Admitting: *Deleted

## 2024-01-22 DIAGNOSIS — E785 Hyperlipidemia, unspecified: Secondary | ICD-10-CM | POA: Diagnosis not present

## 2024-01-23 LAB — HEPATIC FUNCTION PANEL
ALT: 27 IU/L (ref 0–32)
AST: 35 IU/L (ref 0–40)
Albumin: 4.8 g/dL (ref 3.8–4.8)
Alkaline Phosphatase: 66 IU/L (ref 44–121)
Bilirubin Total: 1.4 mg/dL — ABNORMAL HIGH (ref 0.0–1.2)
Bilirubin, Direct: 0.4 mg/dL (ref 0.00–0.40)
Total Protein: 7 g/dL (ref 6.0–8.5)

## 2024-01-23 LAB — LIPID PANEL
Chol/HDL Ratio: 2 ratio (ref 0.0–4.4)
Cholesterol, Total: 127 mg/dL (ref 100–199)
HDL: 63 mg/dL (ref >39)
LDL Chol Calc (NIH): 49 mg/dL (ref 0–99)
Triglycerides: 74 mg/dL (ref 0–149)
VLDL Cholesterol Cal: 15 mg/dL (ref 5–40)

## 2024-01-28 ENCOUNTER — Other Ambulatory Visit: Payer: Self-pay | Admitting: Cardiology

## 2024-04-28 DIAGNOSIS — L821 Other seborrheic keratosis: Secondary | ICD-10-CM | POA: Diagnosis not present

## 2024-04-28 DIAGNOSIS — L814 Other melanin hyperpigmentation: Secondary | ICD-10-CM | POA: Diagnosis not present

## 2024-04-28 DIAGNOSIS — D1801 Hemangioma of skin and subcutaneous tissue: Secondary | ICD-10-CM | POA: Diagnosis not present

## 2024-04-28 DIAGNOSIS — R233 Spontaneous ecchymoses: Secondary | ICD-10-CM | POA: Diagnosis not present

## 2024-07-11 ENCOUNTER — Telehealth: Payer: Self-pay | Admitting: *Deleted

## 2024-07-11 NOTE — Telephone Encounter (Signed)
 Left message for pt to call, she was due for CTA aorta in December but did not schedule because she had a coronary CTA in july 2025 and did not feel like it was due. Unfortunately, the coronary CTA does not mention the size of the aorta, so dr pietro would like to get CTA aorta now to size the aorta.
